# Patient Record
Sex: Female | Born: 1960
Health system: Southern US, Community
[De-identification: ages and names within clinical notes are randomized; demographics above are authoritative.]

## PROBLEM LIST (undated history)

## (undated) DIAGNOSIS — E118 Type 2 diabetes mellitus with unspecified complications: Secondary | ICD-10-CM

## (undated) DIAGNOSIS — I10 Essential (primary) hypertension: Secondary | ICD-10-CM

## (undated) DIAGNOSIS — I214 Non-ST elevation (NSTEMI) myocardial infarction: Secondary | ICD-10-CM

## (undated) DIAGNOSIS — E785 Hyperlipidemia, unspecified: Secondary | ICD-10-CM

## (undated) DIAGNOSIS — I25119 Atherosclerotic heart disease of native coronary artery with unspecified angina pectoris: Secondary | ICD-10-CM

## (undated) DIAGNOSIS — E1169 Type 2 diabetes mellitus with other specified complication: Secondary | ICD-10-CM

## (undated) HISTORY — DX: Atherosclerotic heart disease of native coronary artery with unspecified angina pectoris: I25.119

## (undated) HISTORY — DX: Non-ST elevation (NSTEMI) myocardial infarction: I21.4

## (undated) HISTORY — DX: Hyperlipidemia, unspecified: E78.5

## (undated) HISTORY — DX: Type 2 diabetes mellitus with unspecified complications: E11.8

## (undated) HISTORY — DX: Essential (primary) hypertension: I10

## (undated) HISTORY — DX: Type 2 diabetes mellitus with other specified complication: E11.69

---

## 1996-10-13 HISTORY — PX: LEFT OOPHORECTOMY: SHX1961

## 2011-10-14 DIAGNOSIS — C50919 Malignant neoplasm of unspecified site of unspecified female breast: Secondary | ICD-10-CM

## 2011-10-14 HISTORY — DX: Malignant neoplasm of unspecified site of unspecified female breast: C50.919

## 2012-10-13 DIAGNOSIS — I2510 Atherosclerotic heart disease of native coronary artery without angina pectoris: Secondary | ICD-10-CM | POA: Insufficient documentation

## 2012-10-13 DIAGNOSIS — I214 Non-ST elevation (NSTEMI) myocardial infarction: Secondary | ICD-10-CM

## 2012-10-13 DIAGNOSIS — I252 Old myocardial infarction: Secondary | ICD-10-CM | POA: Insufficient documentation

## 2012-10-13 DIAGNOSIS — I25119 Atherosclerotic heart disease of native coronary artery with unspecified angina pectoris: Secondary | ICD-10-CM

## 2012-10-13 HISTORY — PX: BREAST LUMPECTOMY: SHX2

## 2012-10-13 HISTORY — DX: Atherosclerotic heart disease of native coronary artery with unspecified angina pectoris: I25.119

## 2012-10-13 HISTORY — DX: Non-ST elevation (NSTEMI) myocardial infarction: I21.4

## 2013-02-15 HISTORY — PX: TRANSTHORACIC ECHOCARDIOGRAM: SHX275

## 2013-02-15 HISTORY — PX: CARDIAC CATHETERIZATION: SHX172

## 2013-02-16 HISTORY — PX: PERCUTANEOUS CORONARY STENT INTERVENTION (PCI-S): SHX6016

## 2017-02-16 MED FILL — BRILINTA 90 MG TABLET: 90 | 30 days supply | Qty: 60 | Fill #0

## 2017-02-16 MED FILL — ATORVASTATIN 80 MG TABLET: 80 | 90 days supply | Qty: 90 | Fill #0

## 2017-02-16 MED FILL — LETROZOLE 2.5 MG TABLET: 2.5 | 30 days supply | Qty: 30 | Fill #0

## 2017-02-16 MED FILL — CARVEDILOL 25 MG TABLET: 25 | 30 days supply | Qty: 60 | Fill #0

## 2017-02-16 MED FILL — LISINOPRIL 5 MG TABLET: 5 | 90 days supply | Qty: 90 | Fill #0

## 2017-02-16 MED FILL — metFORMIN HCL 500 MG TABS: 500 | 30 days supply | Qty: 60 | Fill #0

## 2017-03-15 MED FILL — metFORMIN HCL 500 MG TABS: 500 | 30 days supply | Qty: 60 | Fill #1

## 2017-03-15 MED FILL — CARVEDILOL 25 MG TABLET: 25 | 30 days supply | Qty: 60 | Fill #1

## 2017-03-15 MED FILL — BRILINTA 90 MG TABLET: 90 | 30 days supply | Qty: 60 | Fill #1

## 2017-04-20 MED FILL — BRILINTA 90 MG TABLET: 90 | 30 days supply | Qty: 60 | Fill #2

## 2017-04-20 MED FILL — LETROZOLE 2.5 MG TABLET: 2.5 | 30 days supply | Qty: 30 | Fill #0

## 2017-04-20 MED FILL — metFORMIN HCL 500 MG TABS: 500 | 30 days supply | Qty: 60 | Fill #2

## 2017-04-20 MED FILL — CARVEDILOL 25 MG TABLET: 25 | 90 days supply | Qty: 180 | Fill #0

## 2017-05-14 DIAGNOSIS — I10 Essential (primary) hypertension: Secondary | ICD-10-CM | POA: Diagnosis not present

## 2017-05-14 DIAGNOSIS — E6609 Other obesity due to excess calories: Secondary | ICD-10-CM | POA: Diagnosis not present

## 2017-05-14 DIAGNOSIS — E119 Type 2 diabetes mellitus without complications: Secondary | ICD-10-CM | POA: Diagnosis not present

## 2017-05-14 DIAGNOSIS — E7800 Pure hypercholesterolemia, unspecified: Secondary | ICD-10-CM | POA: Diagnosis not present

## 2017-05-14 DIAGNOSIS — Z720 Tobacco use: Secondary | ICD-10-CM | POA: Diagnosis not present

## 2017-05-14 DIAGNOSIS — K219 Gastro-esophageal reflux disease without esophagitis: Secondary | ICD-10-CM | POA: Diagnosis not present

## 2017-05-14 DIAGNOSIS — Z6832 Body mass index (BMI) 32.0-32.9, adult: Secondary | ICD-10-CM | POA: Diagnosis not present

## 2017-05-14 DIAGNOSIS — M25511 Pain in right shoulder: Secondary | ICD-10-CM | POA: Diagnosis not present

## 2017-05-14 DIAGNOSIS — I2510 Atherosclerotic heart disease of native coronary artery without angina pectoris: Secondary | ICD-10-CM | POA: Diagnosis not present

## 2017-05-14 MED FILL — metFORMIN HCL 500 MG TABS: 500 | 90 days supply | Qty: 180 | Fill #0

## 2017-05-14 MED FILL — ATORVASTATIN 80 MG TABLET: 80 | 90 days supply | Qty: 90 | Fill #0

## 2017-05-14 MED FILL — OMEPRAZOLE 20 MG CAP: 20 | 90 days supply | Qty: 90 | Fill #0

## 2017-05-14 MED FILL — ASPIRIN 81 MG CHEWABLE TAB: 81 | 36 days supply | Qty: 36 | Fill #0

## 2017-05-14 MED FILL — LISINOPRIL 5 MG TABLET: 5 | 90 days supply | Qty: 90 | Fill #0

## 2017-05-25 MED FILL — BRILINTA 90 MG TABLET: 90 | 30 days supply | Qty: 60 | Fill #3

## 2017-05-25 MED FILL — LETROZOLE 2.5 MG TABLET: 2.5 | 30 days supply | Qty: 30 | Fill #1

## 2017-06-03 MED FILL — ASPIRIN ADULT LOW STRENGTH: 81 | 90 days supply | Qty: 90 | Fill #0

## 2017-06-08 ENCOUNTER — Telehealth: Payer: Self-pay | Admitting: Cardiology

## 2017-06-08 NOTE — Telephone Encounter (Signed)
Received records from [place] for appointment on 07/02/17 with Dr Ellyn Hack.  Records put with Dr Allison Quarry schedule for 07/02/17. lp

## 2017-06-22 MED FILL — BRILINTA 90 MG TABLET: 90 | 30 days supply | Qty: 60 | Fill #4

## 2017-06-22 MED FILL — LETROZOLE 2.5 MG TABLET: 2.5 | 30 days supply | Qty: 30 | Fill #2

## 2017-07-02 ENCOUNTER — Ambulatory Visit: Payer: Self-pay | Admitting: Cardiology

## 2017-07-02 ENCOUNTER — Encounter: Payer: Self-pay | Admitting: Cardiology

## 2017-07-02 ENCOUNTER — Ambulatory Visit (INDEPENDENT_AMBULATORY_CARE_PROVIDER_SITE_OTHER): Payer: 59 | Admitting: Cardiology

## 2017-07-02 VITALS — BP 128/68 | HR 80 | Ht 65.0 in | Wt 197.8 lb

## 2017-07-02 DIAGNOSIS — E785 Hyperlipidemia, unspecified: Secondary | ICD-10-CM | POA: Diagnosis not present

## 2017-07-02 DIAGNOSIS — I252 Old myocardial infarction: Secondary | ICD-10-CM | POA: Diagnosis not present

## 2017-07-02 DIAGNOSIS — I25119 Atherosclerotic heart disease of native coronary artery with unspecified angina pectoris: Secondary | ICD-10-CM | POA: Diagnosis not present

## 2017-07-02 DIAGNOSIS — E1169 Type 2 diabetes mellitus with other specified complication: Secondary | ICD-10-CM

## 2017-07-02 DIAGNOSIS — I10 Essential (primary) hypertension: Secondary | ICD-10-CM | POA: Diagnosis not present

## 2017-07-02 DIAGNOSIS — E118 Type 2 diabetes mellitus with unspecified complications: Secondary | ICD-10-CM | POA: Diagnosis not present

## 2017-07-02 MED ORDER — CARVEDILOL 25 MG PO TABS
25.0000 mg | ORAL_TABLET | Freq: Two times a day (BID) | ORAL | 3 refills | Status: DC
Start: 2017-07-02 — End: 2021-02-13

## 2017-07-02 MED ORDER — BRILINTA 90 MG PO TABS: 90.0000 mg | ORAL_TABLET | Freq: Two times a day (BID) | ORAL | 3 refills | Status: DC

## 2017-07-02 MED ORDER — LISINOPRIL 5 MG PO TABS: 5.0000 mg | ORAL_TABLET | Freq: Every day | ORAL | 3 refills | Status: DC

## 2017-07-02 NOTE — Progress Notes (Signed)
PCP: Lois Huxley, PA  Clinic Note: Chief Complaint  Patient presents with  . New Patient (Initial Visit)    Establish cardiology care  . Coronary Artery Disease    History of non-STEMI with PCI circumflex     HPI: Cindy Weber is a 56 y.o. female who is being seen today for establishing a new cardiologist in this area. She has a history of MI and cardiovascular disease. Referral is at the request of Lois Huxley, [place]. She has moved from [facility] where she is being cared for by Dr. Redmond Pulling of [facility] in [place]. Per report, she had a history of MI with 2 stents back in 2014. She states aspirin and Brilinta along with carvedilol. She takes 80 mg atorvastatin for lipids.  Cindy Weber was last seen in Jan-Feb by Dr. Redmond Pulling - she was doing well at that time with no active anginal symptoms. She noted some right shoulder pain that responded to muscle relaxant.  Recent Hospitalizations: None  Studies Personally Reviewed - (if available, images/films reviewed: From Epic Chart or Care Everywhere) -- [facility]  02/15/2013: CTA chest -([facility]): No PE. No cardiomyopathy. Mild-moderate centrilobular and paraseptal emphysema. Few tiny groundglass nodules.  02/15/2013 ECHO:  Normal LV Size & function. EF ~55-60%.  GR1DD. Mild MR & LAE.   02/16/2013 - cath-pci, 100%CTOof RCA (small co-dom);  PCI dCX 95%,- Xience expedition DES 2.25 mm x 15 mm overlap approximately with a Xience exhibition DES 2.5 mm x 23 mm -postdilated to 2.5 mm throughout.  Interval History: Cindy Weber presents today for establishing cardiology care here in [facility] really without any major cardiac complaints. She really hasn't had any anginal symptoms with rest or exertion since her MI in 2014. She describes her MI discomfort as being intermittent episodes of chest pain that usually occurred at the end of the day when she would lie down at night. She will feel tired during  the day and fatigued and maybe a little bit short of breath, but didn't notice the chest discomfort until she would lay down. Finally after couple days she decided to go to the emergency room in [place] where she was working. There they were concerned and wondering rule out for MI. Her second set of troponins were highly positive and she therefore was transferred to [facility] with a diagnosis of non-STEMI presenting as angina decubitus.  She has not had any similar symptoms of this. She has had recurrent right shoulder pain for the last couple years and is now finding on seeing orthopedic surgeon for this. She has been very well-controlled from a diabetes standpoint. Last documented A1c was in the 6 range. Her blood pressures at home well-controlled. She is cutting back smoking, but is still not yet ready to give up the last few cigarettes.  No chest pain or shortness of breath with rest or exertion. No PND, orthopnea or edema. No palpitations, lightheadedness, dizziness, weakness or syncope/near syncope. No TIA/amaurosis fugax symptoms. No melena, hematochezia, hematuria, or epstaxis. No claudication.  ROS: A comprehensive was performed. Pertinent symptoms noted above Review of Systems  Constitutional: Negative for malaise/fatigue.  HENT: Negative for congestion.   Respiratory: Positive for cough (Sometimes in the morning. Nonproductive). Negative for shortness of breath and wheezing.   Gastrointestinal: Positive for heartburn. Negative for abdominal pain, constipation and diarrhea.  Musculoskeletal: Negative for myalgias.       Right shoulder pain  Skin: Negative.  Neurological: Negative for dizziness.  Psychiatric/Behavioral: Negative for memory loss. The patient is not nervous/anxious and does not have insomnia.   All other systems reviewed and are negative.   I have reviewed and (if needed) personally updated the patient's problem list, medications, allergies, past medical and  surgical history, social and family history.   Past Medical History:  Diagnosis Date  . Coronary artery disease involving native heart with angina pectoris ([place]) 02/2013   Non-STEMI - 100% CTO RCA (small codominant posterior and.; Normal LAD. The distal circumflex 95% stenosis - PCI with DES 2 (Xience expedition stents overlapping -2.25 mm x 15 mm and 2.5 mm x 23 mm both postdilated 2.5 mm balloon)  . DM (diabetes mellitus), type 2 with complications (HCC)    CAD - MI.   . Essential hypertension   . Hyperlipidemia associated with type 2 diabetes mellitus ([place])   . Non-STEMI (non-ST elevated myocardial infarction) ([place]) 2014   PCI - (2 stents) -formerly cared for by Dr. Redmond Pulling [facility].    Past Surgical History:  Procedure Laterality Date  . BREAST LUMPECTOMY Left 2014  . CARDIAC CATHETERIZATION  02/15/2013   [facility] (Dr. Daiva Huge): Codominant system.  100% CTO of small RCA.  d Cx 95%, normal LAD. -> PCI of dCx  . LEFT OOPHORECTOMY  1998  . PERCUTANEOUS CORONARY STENT INTERVENTION (PCI-S)  02/16/2013   [facility] -(Dr. Daiva Huge): PCI [place] 95%,- Xience expedition DES 2.25 mm x 15 mm overlap approximately with a Xience exhibition DES 2.5 mm x 23 mm -postdilated to 2.5 mm throughout  . TRANSTHORACIC ECHOCARDIOGRAM  02/15/2013   [place]: Normal LV Size & function. EF ~55-60%.  GR1DD. Mild MR & LAE.      Current Meds  Medication Sig  . atorvastatin (LIPITOR) 80 MG tablet Take 1 tablet by mouth daily.  Marland Kitchen BRILINTA 90 MG TABS tablet Take 1 tablet (90 mg total) by mouth 2 (two) times daily.  . carvedilol (COREG) 25 MG tablet Take 1 tablet (25 mg total) by mouth 2 (two) times daily.  . cyanocobalamin (V-R VITAMIN B-12) 500 MCG tablet Take 1 tablet by mouth daily.  Marland Kitchen letrozole (FEMARA) 2.5 MG tablet Take 1 tablet by mouth daily.  Marland Kitchen lisinopril (PRINIVIL,ZESTRIL) 5 MG tablet Take 1 tablet (5 mg total) by mouth daily.  . metFORMIN (GLUCOPHAGE) 500 MG tablet Take 1 tablet by mouth 2 (two) times daily.    Marland Kitchen omeprazole (PRILOSEC) 20 MG capsule Take 1 capsule by mouth daily.  . [DISCONTINUED] aspirin 81 MG EC tablet Take 1 tablet by mouth daily.  . [DISCONTINUED] BRILINTA 90 MG TABS tablet Take 1 tablet by mouth 2 (two) times daily.  . [DISCONTINUED] carvedilol (COREG) 25 MG tablet Take 25 mg by mouth 2 (two) times daily.  . [DISCONTINUED] lisinopril (PRINIVIL,ZESTRIL) 5 MG tablet Take 1 tablet by mouth daily.    Not on File  Social History   Social History  . Marital status: Married    Spouse name: N/A  . Number of children: N/A  . Years of education: N/A   Social History Main Topics  . Smoking status: Current Every Day Smoker    Types: Cigarettes  . Smokeless tobacco: Never Used  . Alcohol use None  . Drug use: Unknown  . Sexual activity: Not Asked   Other Topics Concern  . None   Social History Narrative   She is married to [place] who is a retired Network engineer. She smokes about half pack a day and has  done so for many years. No recreational drug use. Rare alcohol use.   I have 3 children aged 65, 83 and 25. She currently works as a Chartered certified accountant for [employer]  She works as a Chartered certified accountant for [employer].  family history includes Diabetes in her brother, maternal grandfather, mother, sister, and son; Heart attack in her brother; Heart disease in her brother and mother; Hypertension in her brother, brother, daughter, sister, and son; Liver disease in her father.  Wt Readings from Last 3 Encounters:  07/02/17 197 lb 12.8 oz (89.7 kg)    PHYSICAL EXAM BP 128/68   Pulse 80   Ht 5\' 5"  (1.651 m)   Wt 197 lb 12.8 oz (89.7 kg)   BMI 32.92 kg/m  Physical Exam  Constitutional: She is oriented to person, place, and time. She appears well-developed and well-nourished. No distress.  Mildly obese. Well groomed. She does smell of cigarette smoke.  HENT:  Head: Normocephalic.  Eyes: Pupils are equal, round, and reactive to light. Conjunctivae and EOM are normal. No scleral  icterus.  Neck: Normal range of motion. Neck supple. No hepatojugular reflux and no JVD present. Carotid bruit is not present.  Cardiovascular: Normal rate, regular rhythm, normal heart sounds and normal pulses.   No extrasystoles are present. PMI is not displaced.  Exam reveals no gallop, no distant heart sounds and no midsystolic click.   No murmur heard. Pulmonary/Chest: Effort normal and breath sounds normal. No respiratory distress. She has no wheezes. She has no rales.  Mild diffuse interstitial sounds  Abdominal: Soft. Bowel sounds are normal. She exhibits no distension. There is no tenderness. There is no rebound.  Mild truncal obesity  Musculoskeletal: Normal range of motion. She exhibits no edema.  Lymphadenopathy:    She has no cervical adenopathy.  Neurological: She is alert and oriented to person, place, and time. No cranial nerve deficit.  Skin: Skin is warm and dry. No rash noted. No erythema.  Psychiatric: She has a normal mood and affect. Her behavior is normal. Judgment normal.  Nursing note and vitals reviewed.    Adult ECG Report  Rate: 80 ;  Rhythm: normal sinus rhythm and 1 AVB (PR interval 208). Otherwise normal axis, intervals and durations.;   Narrative Interpretation: Essentially Normal EKG  Other studies Reviewed: Additional studies/ records that were reviewed today include:  Recent Labs: 05/14/17 (PCP)   Na+ 140, K+ 4.4, Cl- 106, HCO3- 28 , BUN 13, Cr 0.68, Glu 114, Ca2+ 10.1; AST 12, ALT, 18, AlkP 50  CBC: W 8.7, H/H 13.5/40, Plt 229  TC 65, TG 71, HDL 21, LDL 30; A1c 6.5    ASSESSMENT / PLAN: Problem List Items Addressed This Visit    Coronary artery disease involving native heart with angina pectoris ([place]) - Primary (Chronic)    2 vessel disease with PCI to the circumflex and known occlusion of the non-or codominant RCA that seemed to be small and not significant. Preserved ejection fraction on LV gram and echo. No active anginal symptoms. She has  been maintained on a good solid regimen.   She is on aspirin plus Brilinta and I think at this stage we can stop the aspirin since she is having some upset stomach. Would also potentially reduce Brilinta suggested to 60 mg tablets. She is on high-dose atorvastatin with excellent lipids. Maybe about a back off on that dose to 40 mg. She is on high-dose carvedilol and low-dose lisinopril with well-controlled blood  pressure. She is doing very well and does not require any medication adjustments. We will to simply continue her regular regimen.      Relevant Medications   atorvastatin (LIPITOR) 80 MG tablet   carvedilol (COREG) 25 MG tablet   lisinopril (PRINIVIL,ZESTRIL) 5 MG tablet   Other Relevant Orders   EKG 12-Lead   DM (diabetes mellitus), type 2 with complications (HCC) (Chronic)    Fairly well controlled with hemoglobin A1c of 6.5 on metformin. Defer management to PCP.      Relevant Medications   metFORMIN (GLUCOPHAGE) 500 MG tablet   atorvastatin (LIPITOR) 80 MG tablet   lisinopril (PRINIVIL,ZESTRIL) 5 MG tablet   Other Relevant Orders   EKG 12-Lead   Essential hypertension (Chronic)    Blood pressure well controlled on current regimen.      Relevant Medications   atorvastatin (LIPITOR) 80 MG tablet   carvedilol (COREG) 25 MG tablet   lisinopril (PRINIVIL,ZESTRIL) 5 MG tablet   Other Relevant Orders   EKG 12-Lead   History of MI (myocardial infarction) (Chronic)    Lumbar with expected some inferior wall motion changes with an occluded RCA and severe circumflex which disease. EF however is preserved on echocardiogram and her circumflex was revascularized with 2 overlapping stents. In the absence of symptoms, I don't think we need to do a additional testing at this time. Simply continue home medications.      Relevant Orders   EKG 12-Lead   Hyperlipidemia associated with type 2 diabetes mellitus ([place]) (Chronic)    Outstanding lipids on last check. I think we can probably  reduce atorvastatin to 40 mg if not 20. Will defer to PCP  In the absence of any arthralgias or myalgias.      Relevant Medications   metFORMIN (GLUCOPHAGE) 500 MG tablet   atorvastatin (LIPITOR) 80 MG tablet   carvedilol (COREG) 25 MG tablet   lisinopril (PRINIVIL,ZESTRIL) 5 MG tablet   Other Relevant Orders   EKG 12-Lead      Current medicines are reviewed at length with the patient today. (+/- concerns) n/a The following changes have been made: n/a  Patient Instructions   CHANGES WITH CURRENT MEDICATION   STOP ASPIRIN   NO OTHER CHANGES WITH TREATMENT   Your physician wants you to follow-up in [place] DR HARDING.You will receive a reminder letter in the mail two months in advance. If you don't receive a letter, please call our office to schedule the follow-up appointment.   If you need a refill on your cardiac medications before your next appointment, please call your pharmacy.     Studies Ordered:   Orders Placed This Encounter  Procedures  . EKG 12-Lead      Glenetta Hew, M.D., M.S. Interventional Cardiologist   Pager # [phone] Phone # [phone] [address] [place], [place] [address]

## 2017-07-02 NOTE — Patient Instructions (Addendum)
CHANGES WITH CURRENT MEDICATION   STOP ASPIRIN   NO OTHER CHANGES WITH TREATMENT   Your physician wants you to follow-up in 12 MONTHS WITH DR HARDING.You will receive a reminder letter in the mail two months in advance. If you don't receive a letter, please call our office to schedule the follow-up appointment.   If you need a refill on your cardiac medications before your next appointment, please call your pharmacy.

## 2017-07-04 ENCOUNTER — Encounter: Payer: Self-pay | Admitting: Cardiology

## 2017-07-04 NOTE — Assessment & Plan Note (Signed)
Fairly well controlled with hemoglobin A1c of 6.5 on metformin. Defer management to PCP.

## 2017-07-04 NOTE — Assessment & Plan Note (Signed)
Blood pressure well controlled on current regimen

## 2017-07-04 NOTE — Assessment & Plan Note (Signed)
Lumbar with expected some inferior wall motion changes with an occluded RCA and severe circumflex which disease. EF however is preserved on echocardiogram and her circumflex was revascularized with 2 overlapping stents. In the absence of symptoms, I don't think we need to do a additional testing at this time. Simply continue home medications.

## 2017-07-04 NOTE — Assessment & Plan Note (Signed)
Outstanding lipids on last check. I think we can probably reduce atorvastatin to 40 mg if not 20. Will defer to PCP  In the absence of any arthralgias or myalgias.

## 2017-07-04 NOTE — Assessment & Plan Note (Signed)
2 vessel disease with PCI to the circumflex and known occlusion of the non-or codominant RCA that seemed to be small and not significant. Preserved ejection fraction on LV gram and echo. No active anginal symptoms. She has been maintained on a good solid regimen.   She is on aspirin plus Brilinta and I think at this stage we can stop the aspirin since she is having some upset stomach. Would also potentially reduce Brilinta suggested to 60 mg tablets. She is on high-dose atorvastatin with excellent lipids. Maybe about a back off on that dose to 40 mg. She is on high-dose carvedilol and low-dose lisinopril with well-controlled blood pressure. She is doing very well and does not require any medication adjustments. We will to simply continue her regular regimen.

## 2017-07-27 MED FILL — BRILINTA 90 MG TABLET: 90 | 90 days supply | Qty: 180 | Fill #0

## 2017-07-27 MED FILL — LETROZOLE 2.5 MG TABLET: 2.5 | 90 days supply | Qty: 90 | Fill #3

## 2017-07-27 MED FILL — CARVEDILOL 25 MG TABLET: 25 | 90 days supply | Qty: 180 | Fill #1

## 2017-08-05 ENCOUNTER — Encounter (INDEPENDENT_AMBULATORY_CARE_PROVIDER_SITE_OTHER): Payer: Self-pay | Admitting: Orthopedic Surgery

## 2017-08-05 ENCOUNTER — Ambulatory Visit (INDEPENDENT_AMBULATORY_CARE_PROVIDER_SITE_OTHER): Payer: 59

## 2017-08-05 ENCOUNTER — Ambulatory Visit (INDEPENDENT_AMBULATORY_CARE_PROVIDER_SITE_OTHER): Payer: 59 | Admitting: Orthopedic Surgery

## 2017-08-05 DIAGNOSIS — G8929 Other chronic pain: Secondary | ICD-10-CM

## 2017-08-05 DIAGNOSIS — M25511 Pain in right shoulder: Secondary | ICD-10-CM

## 2017-08-05 DIAGNOSIS — M7501 Adhesive capsulitis of right shoulder: Secondary | ICD-10-CM | POA: Diagnosis not present

## 2017-08-05 MED ORDER — IBUPROFEN-FAMOTIDINE 800-26.6 MG PO TABS: 1.0000 | ORAL_TABLET | Freq: Two times a day (BID) | ORAL | 0 refills | Status: AC | PRN

## 2017-08-05 NOTE — Progress Notes (Signed)
Office Visit Note   Patient: Cindy Weber           Date of Birth: 12/01/1960           MRN: [id number] Visit Date: 08/05/2017 Requested by: Lois Huxley, [place], [place] [address] PCP: Lois Huxley, PA  Subjective: Chief Complaint  Patient presents with  . Right Shoulder - Pain    HPI: Cindy Weber is a 56 year old patient with right shoulder pain.  Denies any history of injury.  Describes pain for 2 years.  She states the pain is getting worse.  She is right-hand dominant.  Denies any neck pain or radicular pain but does report occasional numbness and tingling in the hand dorsal aspect.  States the pain localizes to the shoulder region.  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  She states that abduction hurts as well as crossed arm adduction.  Takes occasional ibuprofen which doesn't help.  Overhead difficulty is encountered with any overhead motion.              ROS: All systems reviewed are negative as they relate to the chief complaint within the history of present illness.  Patient denies  fevers or chills.   Assessment & Plan: Visit Diagnoses:  1. Chronic right shoulder pain   2. Adhesive capsulitis of right shoulder     Plan: Impression is right shoulder adhesive capsulitis with good rotator cuff strength and limited passive range of motion.  Discussed this with Cindy Weber in terms of incremental intervention.  She wants to start with Duexis which is prescribed.  We will also try physical therapy for passive range of motion and manual PT for stretching.  I'll see her back in 8 weeks and we could consider intra-articular cortisone injection at that time  Follow-Up Instructions: No Follow-up on file.   Orders:  Orders Placed This Encounter  Procedures  . XR Shoulder Right  . Ambulatory referral to Physical Therapy   Meds ordered this encounter  Medications  . Ibuprofen-Famotidine (DUEXIS) 800-26.6 MG TABS    Sig: Take 1 tablet by  mouth 2 (two) times daily as needed.    Dispense:  90 tablet    Refill:  0      Procedures: No procedures performed   Clinical Data: No additional findings.  Objective: Vital Signs: There were no vitals taken for this visit.  Physical Exam:   Constitutional: Patient appears well-developed HEENT:  Head: Normocephalic Eyes:EOM are normal Neck: Normal range of motion Cardiovascular: Normal rate Pulmonary/chest: Effort normal Neurologic: Patient is alert Skin: Skin is warm Psychiatric: Patient has normal mood and affect    Ortho Exam: Orthopedic exam demonstrates good cervical spine range of motion with no paresthesias C5-T1.  Radial pulses intact.  No masses lymph adenopathy or skin changes noted in the neck or shoulder girdle region.  She does have limited external rotation at 15 of abduction on the right to 45 on the left 75.  Isolated glenohumeral abduction 80 on the right 110 on the left.  Forward flexion is 120 on the right compared to 180 on the left.  Rotator cuff strength on the right is excellent to infraspinatus supraspinatus and subscap muscle testing.  No coarseness or grinding with internal/external rotation of that right arm localizing to the joint.  She does have a little bit of acromioclavicular joint crepitus with range of motion  Specialty Comments:  No specialty comments available.  Imaging: Xr  Shoulder Right  Result Date: 08/05/2017 AP axillary lateral outlet view right shoulder reviewed.  Moderate acromioclavicular joint arthritis is noted with type I acromion.  No fracture or dislocation present.  No glenohumeral arthritis present.  Visualized lung fields clear    PMFS History: Patient Active Problem List   Diagnosis Date Noted  . DM (diabetes mellitus), type 2 with complications ([place])   . Essential hypertension   . Hyperlipidemia associated with type 2 diabetes mellitus ([place])   . Coronary artery disease involving native heart with angina  pectoris ([place]) 10/13/2012  . History of MI (myocardial infarction) 10/13/2012   Past Medical History:  Diagnosis Date  . Coronary artery disease involving native heart with angina pectoris ([place]) 02/2013   Non-STEMI - 100% CTO RCA (small codominant posterior and.; Normal LAD. The distal circumflex 95% stenosis - PCI with DES 2 (Xience expedition stents overlapping -2.25 mm x 15 mm and 2.5 mm x 23 mm both postdilated 2.5 mm balloon)  . DM (diabetes mellitus), type 2 with complications (HCC)    CAD - MI.   . Essential hypertension   . Hyperlipidemia associated with type 2 diabetes mellitus ([place])   . Non-STEMI (non-ST elevated myocardial infarction) ([place]) 2014   PCI - (2 stents) -formerly cared for by Dr. Redmond Pulling [facility].    Family History  Problem Relation Age of Onset  . Diabetes Mother   . Heart disease Mother   . Liver disease Father   . Healthy Sister   . Hypertension Brother   . Diabetes Maternal Grandfather   . Diabetes Sister   . Hypertension Sister   . Heart disease Brother   . Heart attack Brother   . Diabetes Brother   . Hypertension Brother   . Hypertension Daughter   . Hypertension Son   . Diabetes Son   . Diabetes Son     Past Surgical History:  Procedure Laterality Date  . BREAST LUMPECTOMY Left 2014  . CARDIAC CATHETERIZATION  02/15/2013   [facility] (Dr. Daiva Huge): Codominant system.  100% CTO of small RCA.  d Cx 95%, normal LAD. -> PCI of dCx  . LEFT OOPHORECTOMY  1998  . PERCUTANEOUS CORONARY STENT INTERVENTION (PCI-S)  02/16/2013   [facility] -(Dr. Daiva Huge): PCI [place] 95%,- Xience expedition DES 2.25 mm x 15 mm overlap approximately with a Xience exhibition DES 2.5 mm x 23 mm -postdilated to 2.5 mm throughout  . TRANSTHORACIC ECHOCARDIOGRAM  02/15/2013   [place]: Normal LV Size & function. EF ~55-60%.  GR1DD. Mild MR & LAE.    Social History   Occupational History  . Not on file.   Social History Main Topics  . Smoking status: Current Every Day Smoker     Types: Cigarettes  . Smokeless tobacco: Never Used  . Alcohol use Not on file  . Drug use: Unknown  . Sexual activity: Not on file

## 2017-08-31 MED FILL — ASPIRIN ADULT LOW STRENGTH: 81 | 90 days supply | Qty: 90 | Fill #1

## 2017-08-31 MED FILL — metFORMIN HCL 500 MG TABS: 500 | 90 days supply | Qty: 180 | Fill #1

## 2017-08-31 MED FILL — OMEPRAZOLE 20 MG CAP: 20 | 90 days supply | Qty: 90 | Fill #1

## 2017-08-31 MED FILL — LISINOPRIL 5 MG TAB: 5 | 90 days supply | Qty: 90 | Fill #1

## 2017-09-09 MED FILL — ATORVASTATIN 80 MG TABLET: 80 | 90 days supply | Qty: 90 | Fill #1

## 2017-09-15 DIAGNOSIS — I10 Essential (primary) hypertension: Secondary | ICD-10-CM | POA: Diagnosis not present

## 2017-09-15 DIAGNOSIS — Z7984 Long term (current) use of oral hypoglycemic drugs: Secondary | ICD-10-CM | POA: Diagnosis not present

## 2017-09-15 DIAGNOSIS — E6609 Other obesity due to excess calories: Secondary | ICD-10-CM | POA: Diagnosis not present

## 2017-09-15 DIAGNOSIS — Z853 Personal history of malignant neoplasm of breast: Secondary | ICD-10-CM | POA: Diagnosis not present

## 2017-09-15 DIAGNOSIS — I2510 Atherosclerotic heart disease of native coronary artery without angina pectoris: Secondary | ICD-10-CM | POA: Diagnosis not present

## 2017-09-15 DIAGNOSIS — E7800 Pure hypercholesterolemia, unspecified: Secondary | ICD-10-CM | POA: Diagnosis not present

## 2017-09-15 DIAGNOSIS — K219 Gastro-esophageal reflux disease without esophagitis: Secondary | ICD-10-CM | POA: Diagnosis not present

## 2017-09-15 DIAGNOSIS — Z720 Tobacco use: Secondary | ICD-10-CM | POA: Diagnosis not present

## 2017-09-15 DIAGNOSIS — Z6832 Body mass index (BMI) 32.0-32.9, adult: Secondary | ICD-10-CM | POA: Diagnosis not present

## 2017-09-15 DIAGNOSIS — E1165 Type 2 diabetes mellitus with hyperglycemia: Secondary | ICD-10-CM | POA: Diagnosis not present

## 2017-09-30 ENCOUNTER — Ambulatory Visit (INDEPENDENT_AMBULATORY_CARE_PROVIDER_SITE_OTHER): Payer: 59 | Admitting: Orthopedic Surgery

## 2017-09-30 ENCOUNTER — Encounter (INDEPENDENT_AMBULATORY_CARE_PROVIDER_SITE_OTHER): Payer: Self-pay | Admitting: Orthopedic Surgery

## 2017-09-30 DIAGNOSIS — M25511 Pain in right shoulder: Principal | ICD-10-CM

## 2017-09-30 DIAGNOSIS — G8929 Other chronic pain: Secondary | ICD-10-CM | POA: Diagnosis not present

## 2017-10-03 ENCOUNTER — Encounter (INDEPENDENT_AMBULATORY_CARE_PROVIDER_SITE_OTHER): Payer: Self-pay | Admitting: Orthopedic Surgery

## 2017-10-03 DIAGNOSIS — M25511 Pain in right shoulder: Secondary | ICD-10-CM

## 2017-10-03 DIAGNOSIS — G8929 Other chronic pain: Secondary | ICD-10-CM

## 2017-10-03 MED ORDER — LIDOCAINE HCL 1 % IJ SOLN
5.0000 mL | INTRAMUSCULAR | Status: AC | PRN
  Administered 2017-10-03: 5 mL

## 2017-10-03 MED ORDER — BUPIVACAINE HCL 0.5 % IJ SOLN
9.0000 mL | INTRAMUSCULAR | Status: AC | PRN
  Administered 2017-10-03: 9 mL via INTRA_ARTICULAR

## 2017-10-03 MED ORDER — METHYLPREDNISOLONE ACETATE 40 MG/ML IJ SUSP
40.0000 mg | INTRAMUSCULAR | Status: AC | PRN
  Administered 2017-10-03: 40 mg via INTRA_ARTICULAR

## 2017-10-03 NOTE — Progress Notes (Signed)
Office Visit Note   Patient: Cindy Weber           Date of Birth: 01-18-1961           MRN: [id number] Visit Date: 09/30/2017 Requested by: Lois Huxley, [address] [place], [place] [address] PCP: Lois Huxley, PA  Subjective: Chief Complaint  Patient presents with  . Right Shoulder - Follow-up    HPI: [name] presents for follow-up of right shoulder pain.  Duexis helped her some.  Hurt her stomach a little bit.  She did not do any physical therapy.  She reports pain in the right shoulder.  Currently is working.  Would like a cortisone injection if possible.  She is okay sleeping on the right-hand side.  Occasionally the pain will radiate down to the hand.  Her husband is disabled from strokes.  She cannot really do anything in terms of surgery right now..              ROS:All systems reviewed are negative as they relate to the chief complaint within the history of present illness.  Patient denies  fevers or chills.  Assessment & Plan: Visit Diagnoses:  1. Chronic right shoulder pain     Plan: Impression is right shoulder pain.  Injection into the right shoulder glenohumeral joint.  If this is helped her before.  We will try again for that injection.  I will see her back as needed.  Follow-Up Instructions: Return if symptoms worsen or fail to improve.   Orders:  No orders of the defined types were placed in this encounter.  No orders of the defined types were placed in this encounter.     Procedures: Large Joint Inj: R glenohumeral on 10/03/2017 9:26 AM Indications: diagnostic evaluation and pain Details: 18 G 1.5 in needle, posterior approach  Arthrogram: No  Medications: 9 mL bupivacaine 0.5 %; 40 mg methylPREDNISolone acetate 40 MG/ML; 5 mL lidocaine 1 % Outcome: tolerated well, no immediate complications Procedure, treatment alternatives, risks and benefits explained, specific risks discussed. Consent was given by the patient. Immediately prior to  procedure a time out was called to verify the correct patient, procedure, equipment, support staff and site/side marked as required. Patient was prepped and draped in the usual sterile fashion.       Clinical Data: No additional findings.  Objective: Vital Signs: There were no vitals taken for this visit.  Physical Exam:   Constitutional: Patient appears well-developed HEENT:  Head: Normocephalic Eyes:EOM are normal Neck: Normal range of motion Cardiovascular: Normal rate Pulmonary/chest: Effort normal Neurologic: Patient is alert Skin: Skin is warm Psychiatric: Patient has normal mood and affect    Ortho Exam: Orthopedic exam demonstrates reasonable cervical spine range of motion.  Patient has right shoulder pain with motion.  Rotator cuff strength is slightly weak on the right compared to the left.  No masses lymphadenopathy or skin changes noted in the right shoulder girdle Specialty Comments:  No specialty comments available.  Imaging: No results found.   PMFS History: Patient Active Problem List   Diagnosis Date Noted  . DM (diabetes mellitus), type 2 with complications ([place])   . Essential hypertension   . Hyperlipidemia associated with type 2 diabetes mellitus ([place])   . Coronary artery disease involving native heart with angina pectoris ([place]) 10/13/2012  . History of MI (myocardial infarction) 10/13/2012   Past Medical History:  Diagnosis Date  . Coronary artery disease involving native heart with  angina pectoris ([place]) 02/2013   Non-STEMI - 100% CTO RCA (small codominant posterior and.; Normal LAD. The distal circumflex 95% stenosis - PCI with DES 2 (Xience expedition stents overlapping -2.25 mm x 15 mm and 2.5 mm x 23 mm both postdilated 2.5 mm balloon)  . DM (diabetes mellitus), type 2 with complications (HCC)    CAD - MI.   . Essential hypertension   . Hyperlipidemia associated with type 2 diabetes mellitus ([place])   . Non-STEMI (non-ST elevated myocardial  infarction) ([place]) 2014   PCI - (2 stents) -formerly cared for by Dr. Redmond Pulling [facility].    Family History  Problem Relation Age of Onset  . Diabetes Mother   . Heart disease Mother   . Liver disease Father   . Healthy Sister   . Hypertension Brother   . Diabetes Maternal Grandfather   . Diabetes Sister   . Hypertension Sister   . Heart disease Brother   . Heart attack Brother   . Diabetes Brother   . Hypertension Brother   . Hypertension Daughter   . Hypertension Son   . Diabetes Son   . Diabetes Son     Past Surgical History:  Procedure Laterality Date  . BREAST LUMPECTOMY Left 2014  . CARDIAC CATHETERIZATION  02/15/2013   [facility] (Dr. Daiva Huge): Codominant system.  100% CTO of small RCA.  d Cx 95%, normal LAD. -> PCI of dCx  . LEFT OOPHORECTOMY  1998  . PERCUTANEOUS CORONARY STENT INTERVENTION (PCI-S)  02/16/2013   [facility] -(Dr. Daiva Huge): PCI [place] 95%,- Xience expedition DES 2.25 mm x 15 mm overlap approximately with a Xience exhibition DES 2.5 mm x 23 mm -postdilated to 2.5 mm throughout  . TRANSTHORACIC ECHOCARDIOGRAM  02/15/2013   [place]: Normal LV Size & function. EF ~55-60%.  GR1DD. Mild MR & LAE.    Social History   Occupational History  . Not on file  Tobacco Use  . Smoking status: Current Every Day Smoker    Types: Cigarettes  . Smokeless tobacco: Never Used  Substance and Sexual Activity  . Alcohol use: Not on file  . Drug use: Not on file  . Sexual activity: Not on file

## 2017-10-30 MED FILL — CARVEDILOL 25 MG TABS: 25 | 90 days supply | Qty: 180 | Fill #0

## 2017-10-30 MED FILL — BRILINTA 90 MG TABLET: 90 | 90 days supply | Qty: 180 | Fill #1

## 2017-10-30 MED FILL — LETROZOLE 2.5 MG TABLET: 2.5 | 90 days supply | Qty: 90 | Fill #4

## 2017-10-30 MED FILL — metFORMIN HCL 500 MG TABS: 500 | 90 days supply | Qty: 360 | Fill #0

## 2017-12-09 MED FILL — LISINOPRIL 5 MG TAB: 5 | 90 days supply | Qty: 90 | Fill #0

## 2017-12-09 MED FILL — OMEPRAZOLE 20 MG CAP: 20 | 90 days supply | Qty: 90 | Fill #0

## 2018-01-08 MED FILL — ATORVASTATIN 80 MG TABLET: 80 | 90 days supply | Qty: 90 | Fill #0

## 2018-01-08 MED FILL — ASPIRIN ADULT LOW STRENGTH: 81 | 90 days supply | Qty: 90 | Fill #0

## 2018-01-29 MED FILL — CARVEDILOL 25 MG TABS: 25 | 90 days supply | Qty: 180 | Fill #1

## 2018-01-29 MED FILL — BRILINTA 90 MG TABLET: 90 | 90 days supply | Qty: 180 | Fill #2

## 2018-02-01 MED FILL — metFORMIN HCL 500 MG TABS: 500 | 90 days supply | Qty: 360 | Fill #1

## 2018-02-01 MED FILL — LETROZOLE 2.5 MG TABLET: 2.5 | 90 days supply | Qty: 90 | Fill #5

## 2018-03-18 MED FILL — LISINOPRIL 5 MG TABLET: 5 | 90 days supply | Qty: 90 | Fill #1

## 2018-03-19 DIAGNOSIS — E7800 Pure hypercholesterolemia, unspecified: Secondary | ICD-10-CM | POA: Diagnosis not present

## 2018-03-19 DIAGNOSIS — Z720 Tobacco use: Secondary | ICD-10-CM | POA: Diagnosis not present

## 2018-03-19 DIAGNOSIS — K219 Gastro-esophageal reflux disease without esophagitis: Secondary | ICD-10-CM | POA: Diagnosis not present

## 2018-03-19 DIAGNOSIS — Z0000 Encounter for general adult medical examination without abnormal findings: Secondary | ICD-10-CM | POA: Diagnosis not present

## 2018-03-19 DIAGNOSIS — E119 Type 2 diabetes mellitus without complications: Secondary | ICD-10-CM | POA: Diagnosis not present

## 2018-03-19 DIAGNOSIS — E669 Obesity, unspecified: Secondary | ICD-10-CM | POA: Diagnosis not present

## 2018-03-19 DIAGNOSIS — Z853 Personal history of malignant neoplasm of breast: Secondary | ICD-10-CM | POA: Diagnosis not present

## 2018-03-19 DIAGNOSIS — I2510 Atherosclerotic heart disease of native coronary artery without angina pectoris: Secondary | ICD-10-CM | POA: Diagnosis not present

## 2018-03-19 DIAGNOSIS — I10 Essential (primary) hypertension: Secondary | ICD-10-CM | POA: Diagnosis not present

## 2018-03-24 MED FILL — JARDIANCE 10 MG TABLET: 10 | 90 days supply | Qty: 90 | Fill #0

## 2018-05-10 MED FILL — CARVEDILOL 25 MG TABLET: 25 | 90 days supply | Qty: 180 | Fill #2

## 2018-05-10 MED FILL — ASPIRIN ADULT LOW STRENGTH: 81 | 90 days supply | Qty: 90 | Fill #0

## 2018-05-10 MED FILL — BRILINTA 90 MG TABLET: 90 | 90 days supply | Qty: 180 | Fill #3

## 2018-05-10 MED FILL — metFORMIN HCL 500 MG TABS: 500 | 90 days supply | Qty: 360 | Fill #0

## 2018-06-25 DIAGNOSIS — E669 Obesity, unspecified: Secondary | ICD-10-CM | POA: Diagnosis not present

## 2018-06-25 DIAGNOSIS — I10 Essential (primary) hypertension: Secondary | ICD-10-CM | POA: Diagnosis not present

## 2018-06-25 DIAGNOSIS — E7800 Pure hypercholesterolemia, unspecified: Secondary | ICD-10-CM | POA: Diagnosis not present

## 2018-06-25 DIAGNOSIS — J0100 Acute maxillary sinusitis, unspecified: Secondary | ICD-10-CM | POA: Diagnosis not present

## 2018-06-25 DIAGNOSIS — E1169 Type 2 diabetes mellitus with other specified complication: Secondary | ICD-10-CM | POA: Diagnosis not present

## 2018-06-25 DIAGNOSIS — Z853 Personal history of malignant neoplasm of breast: Secondary | ICD-10-CM | POA: Diagnosis not present

## 2018-06-25 DIAGNOSIS — K219 Gastro-esophageal reflux disease without esophagitis: Secondary | ICD-10-CM | POA: Diagnosis not present

## 2018-06-25 DIAGNOSIS — R3915 Urgency of urination: Secondary | ICD-10-CM | POA: Diagnosis not present

## 2018-06-25 DIAGNOSIS — I2510 Atherosclerotic heart disease of native coronary artery without angina pectoris: Secondary | ICD-10-CM | POA: Diagnosis not present

## 2018-06-25 MED FILL — LISINOPRIL 5 MG TABLET: 5 | 90 days supply | Qty: 90 | Fill #0

## 2018-06-25 MED FILL — AMOXICILLIN 875 MG TABS: 875 | 10 days supply | Qty: 20 | Fill #0

## 2018-06-25 MED FILL — ATORVASTATIN 40 MG TABLET: 40 | 90 days supply | Qty: 45 | Fill #0

## 2018-06-25 MED FILL — OMEPRAZOLE 20 MG CPDR: 20 | 90 days supply | Qty: 90 | Fill #0

## 2018-07-19 DIAGNOSIS — J018 Other acute sinusitis: Secondary | ICD-10-CM | POA: Diagnosis not present

## 2018-07-19 DIAGNOSIS — J209 Acute bronchitis, unspecified: Secondary | ICD-10-CM | POA: Diagnosis not present

## 2018-07-20 MED FILL — CEFUROXIME AXETIL 500 MG TA: 500 | 10 days supply | Qty: 20 | Fill #0

## 2018-07-30 MED FILL — JARDIANCE 10 MG TABLET: 10 | 90 days supply | Qty: 90 | Fill #1

## 2018-08-05 DIAGNOSIS — R05 Cough: Secondary | ICD-10-CM | POA: Diagnosis not present

## 2018-08-05 MED FILL — AZITHROMYCIN 250 MG TABLET: 250 | 6 days supply | Qty: 6 | Fill #0

## 2018-08-05 MED FILL — BENZONATATE 200 MG CAPS: 200 | 20 days supply | Qty: 60 | Fill #0

## 2018-08-05 MED FILL — LOSARTAN POTASSIUM 25 MG TA: 25 | 90 days supply | Qty: 90 | Fill #0

## 2018-08-06 ENCOUNTER — Other Ambulatory Visit (HOSPITAL_COMMUNITY): Payer: Self-pay | Admitting: Family Medicine

## 2018-08-06 ENCOUNTER — Ambulatory Visit (HOSPITAL_COMMUNITY)
Admission: RE | Admit: 2018-08-06 | Discharge: 2018-08-06 | Disposition: A | Discharge status: Home / Self Care | Payer: 59 | Source: Ambulatory Visit | Attending: Family Medicine | Admitting: Family Medicine

## 2018-08-06 DIAGNOSIS — R059 Cough, unspecified: Secondary | ICD-10-CM

## 2018-08-06 DIAGNOSIS — R918 Other nonspecific abnormal finding of lung field: Secondary | ICD-10-CM | POA: Diagnosis not present

## 2018-08-06 DIAGNOSIS — I2510 Atherosclerotic heart disease of native coronary artery without angina pectoris: Secondary | ICD-10-CM | POA: Diagnosis not present

## 2018-08-06 DIAGNOSIS — R05 Cough: Secondary | ICD-10-CM | POA: Insufficient documentation

## 2018-08-12 ENCOUNTER — Other Ambulatory Visit: Payer: Self-pay | Admitting: Cardiology

## 2018-08-12 MED FILL — CARVEDILOL 25 MG TABLET: 25 | 90 days supply | Qty: 180 | Fill #0

## 2018-08-13 MED FILL — BRILINTA 90 MG TABLET: 90 | 30 days supply | Qty: 60 | Fill #0

## 2018-08-25 DIAGNOSIS — Z853 Personal history of malignant neoplasm of breast: Secondary | ICD-10-CM | POA: Diagnosis not present

## 2018-09-17 DIAGNOSIS — E1169 Type 2 diabetes mellitus with other specified complication: Secondary | ICD-10-CM | POA: Diagnosis not present

## 2018-09-17 DIAGNOSIS — I2510 Atherosclerotic heart disease of native coronary artery without angina pectoris: Secondary | ICD-10-CM | POA: Diagnosis not present

## 2018-09-17 DIAGNOSIS — Z853 Personal history of malignant neoplasm of breast: Secondary | ICD-10-CM | POA: Diagnosis not present

## 2018-09-17 DIAGNOSIS — E7800 Pure hypercholesterolemia, unspecified: Secondary | ICD-10-CM | POA: Diagnosis not present

## 2018-09-17 DIAGNOSIS — R05 Cough: Secondary | ICD-10-CM | POA: Diagnosis not present

## 2018-09-17 DIAGNOSIS — Z23 Encounter for immunization: Secondary | ICD-10-CM | POA: Diagnosis not present

## 2018-09-17 DIAGNOSIS — I10 Essential (primary) hypertension: Secondary | ICD-10-CM | POA: Diagnosis not present

## 2018-09-17 DIAGNOSIS — E669 Obesity, unspecified: Secondary | ICD-10-CM | POA: Diagnosis not present

## 2018-09-17 DIAGNOSIS — K219 Gastro-esophageal reflux disease without esophagitis: Secondary | ICD-10-CM | POA: Diagnosis not present

## 2018-09-17 MED FILL — FLUCONAZOLE 150 MG TABS: 150 | 3 days supply | Qty: 2 | Fill #0

## 2018-09-17 MED FILL — BENZONATATE 200 MG CAPS: 200 | 30 days supply | Qty: 90 | Fill #0

## 2018-09-23 ENCOUNTER — Other Ambulatory Visit: Payer: Self-pay | Admitting: Cardiology

## 2018-09-23 MED FILL — LISINOPRIL 5 MG TABLET: 5 | 90 days supply | Qty: 90 | Fill #1

## 2018-09-23 MED FILL — OMEPRAZOLE 20 MG CPDR: 20 | 90 days supply | Qty: 90 | Fill #1

## 2018-09-23 MED FILL — ATORVASTATIN CALCIUM 40 MG: 40 | 90 days supply | Qty: 45 | Fill #1

## 2018-10-27 DIAGNOSIS — Z1231 Encounter for screening mammogram for malignant neoplasm of breast: Secondary | ICD-10-CM | POA: Diagnosis not present

## 2018-10-29 ENCOUNTER — Other Ambulatory Visit: Payer: Self-pay | Admitting: Cardiology

## 2018-10-29 MED FILL — LOSARTAN POTASSIUM 25 MG TA: 25 | 30 days supply | Qty: 30 | Fill #1

## 2018-10-29 MED FILL — JARDIANCE 10 MG TABLET: 10 | 90 days supply | Qty: 90 | Fill #0

## 2018-10-29 MED FILL — BRILINTA 90 MG TABLET: 90 | 30 days supply | Qty: 60 | Fill #0

## 2018-11-01 MED FILL — FLUCONAZOLE 150 MG TABS: 150 | 2 days supply | Qty: 2 | Fill #0

## 2018-11-05 ENCOUNTER — Encounter (INDEPENDENT_AMBULATORY_CARE_PROVIDER_SITE_OTHER): Payer: Self-pay

## 2018-11-05 ENCOUNTER — Ambulatory Visit: Payer: 59 | Admitting: Cardiology

## 2018-11-05 ENCOUNTER — Encounter: Payer: Self-pay | Admitting: Cardiology

## 2018-11-05 VITALS — BP 120/74 | HR 73 | Ht 66.0 in | Wt 191.0 lb

## 2018-11-05 DIAGNOSIS — I10 Essential (primary) hypertension: Secondary | ICD-10-CM

## 2018-11-05 DIAGNOSIS — I2510 Atherosclerotic heart disease of native coronary artery without angina pectoris: Secondary | ICD-10-CM | POA: Diagnosis not present

## 2018-11-05 DIAGNOSIS — E785 Hyperlipidemia, unspecified: Secondary | ICD-10-CM | POA: Diagnosis not present

## 2018-11-05 DIAGNOSIS — E118 Type 2 diabetes mellitus with unspecified complications: Secondary | ICD-10-CM | POA: Diagnosis not present

## 2018-11-05 DIAGNOSIS — I252 Old myocardial infarction: Secondary | ICD-10-CM

## 2018-11-05 DIAGNOSIS — E1169 Type 2 diabetes mellitus with other specified complication: Secondary | ICD-10-CM | POA: Diagnosis not present

## 2018-11-05 MED ORDER — TICAGRELOR 60 MG PO TABS: 60.0000 mg | ORAL_TABLET | Freq: Two times a day (BID) | ORAL | 3 refills | Status: DC

## 2018-11-05 MED FILL — BRILINTA 60 MG TABLET: 60 | 90 days supply | Qty: 180 | Fill #0

## 2018-11-05 NOTE — Patient Instructions (Signed)
Medication Instructions:    STOP TAKING ASPIRIN 81 MG  COMPLETE 90 MG BRILINTA  ,THEN START 60 MG OF BRILINTA TWICE A DAY If you need a refill on your cardiac medications before your next appointment, please call your pharmacy.   Lab work: NOT NEEDED If you have labs (blood work) drawn today and your tests are completely normal, you will receive your results only by: Marland Kitchen MyChart Message (if you have MyChart) OR . A paper copy in the mail If you have any lab test that is abnormal or we need to change your treatment, we will call you to review the results.  Testing/Procedures: NOT NEEDED  Follow-Up: At [facility], you and your health needs are our priority.  As part of our continuing mission to provide you with exceptional heart care, we have created designated Provider Care Teams.  These Care Teams include your primary Cardiologist (physician) and Advanced Practice Providers (APPs -  Physician Assistants and Nurse Practitioners) who all work together to provide you with the care you need, when you need it. You will need a follow up appointment in 12 months JAN 2021.  Please call our office 2 months in advance to schedule this appointment.  You may see Glenetta Hew, MDor one of the following Advanced Practice Providers on your designated Care Team:   Rosaria Ferries, PA-C . Jory Sims, DNP, ANP  Any Other Special Instructions Will Be Listed Below (If Applicable).

## 2018-11-05 NOTE — Progress Notes (Signed)
PCP: Lois Huxley, PA  Clinic Note: Chief Complaint  Patient presents with  . Follow-up    Delayed annual follow-up.  Scheduling issues  . Coronary Artery Disease    No further angina    HPI: Cindy Weber is a 58 y.o. female with a PMH below who presents today for delayed annual (~15 month) f/u for CAD. She has moved from [facility] where she is being cared for by Dr. Redmond Pulling of [facility] in [place].  Per report, she had a history of MI with 2 stents back in 2014. She states aspirin and Brilinta along with carvedilol. She takes 80 mg atorvastatin for lipids.  Cindy Weber was last seen in Sept 2018 to establish a local cardiologist.   No cardiac complaints.  No angina since 2014 MI.  Prior to that would have intermittent end of day episodes of CP when she would rest in the evenings.  She only noted intermittent R shoulder pain for the last few years - different from MI pain.   Recent Hospitalizations: none  Studies Personally Reviewed - (if available, images/films reviewed: From Epic Chart or Care Everywhere)  none  Interval History: Cindy Weber returns today doing quite well.  She does not do routine exercise, but tries to "get in a lot of walking" at work.  She still denies any recurrent MI/angina type chest pain or dyspnea at rest or with exertion.  She still has the intermittent R upper chest/shoulder pain that comes & goes fleetingly -- described as a "nagging" sharp pain that last only a minute or two.    Otherwise denies any notable CV symptoms: No PND, orthopnea or edema.  No palpitations, lightheadedness, dizziness, weakness or syncope/near syncope. No TIA/amaurosis fugax symptoms. No melena, hematochezia, hematuria, or epstaxis. No claudication.  Smoking: down to ~ 1/day, but not yet ready to quit.  ROS: A comprehensive was performed. Review of Systems  Constitutional: Negative for malaise/fatigue and weight loss.  HENT: Negative for congestion.     Respiratory: Negative for cough and shortness of breath.   Gastrointestinal: Negative for abdominal pain and heartburn.  Musculoskeletal: Negative for joint pain and myalgias.  Neurological: Negative for dizziness and headaches.  Psychiatric/Behavioral: The patient is not nervous/anxious and does not have insomnia.   All other systems reviewed and are negative.  I have reviewed and (if needed) personally updated the patient's problem list, medications, allergies, past medical and surgical history, social and family history.   Past Medical History:  Diagnosis Date  . Coronary artery disease involving native heart with angina pectoris ([place]) 02/2013   Non-STEMI - 100% CTO RCA (small codominant posterior and.; Normal LAD. The distal circumflex 95% stenosis - PCI with DES 2 (Xience expedition stents overlapping -2.25 mm x 15 mm and 2.5 mm x 23 mm both postdilated 2.5 mm balloon)  . DM (diabetes mellitus), type 2 with complications (HCC)    CAD - MI.   . Essential hypertension   . Hyperlipidemia associated with type 2 diabetes mellitus ([place])   . Non-STEMI (non-ST elevated myocardial infarction) ([place]) 2014   PCI - (2 stents) -formerly cared for by Dr. Redmond Pulling [facility].    Past Surgical History:  Procedure Laterality Date  . BREAST LUMPECTOMY Left 2014  . CARDIAC CATHETERIZATION  02/15/2013   [facility] (Dr. Daiva Huge): Codominant system.  100% CTO of small RCA.  d Cx 95%, normal LAD. -> PCI of dCx  . LEFT OOPHORECTOMY  1998  . PERCUTANEOUS CORONARY STENT  INTERVENTION (PCI-S)  02/16/2013   [facility] -(Dr. Daiva Huge): PCI [place] 95%,- Xience expedition DES 2.25 mm x 15 mm overlap approximately with a Xience exhibition DES 2.5 mm x 23 mm -postdilated to 2.5 mm throughout  . TRANSTHORACIC ECHOCARDIOGRAM  02/15/2013   [place]: Normal LV Size & function. EF ~55-60%.  GR1DD. Mild MR & LAE.     02/15/2013: CTA chest -([facility]): No PE. No cardiomyopathy. Mild-moderate centrilobular and  paraseptal emphysema. Few tiny groundglass nodules.   Current Meds  Medication Sig  . atorvastatin (LIPITOR) 80 MG tablet Take 1 tablet by mouth daily.  . carvedilol (COREG) 25 MG tablet Take 1 tablet (25 mg total) by mouth 2 (two) times daily.  . cyanocobalamin (V-R VITAMIN B-12) 500 MCG tablet Take 1 tablet by mouth daily.  . Ibuprofen-Famotidine (DUEXIS) 800-26.6 MG TABS Take 1 tablet by mouth 2 (two) times daily as needed.  Marland Kitchen JARDIANCE 10 MG TABS tablet   . letrozole (FEMARA) 2.5 MG tablet Take 1 tablet by mouth daily.  Marland Kitchen losartan (COZAAR) 25 MG tablet   . metFORMIN (GLUCOPHAGE) 500 MG tablet Take 1 tablet by mouth 2 (two) times daily.  Marland Kitchen omeprazole (PRILOSEC) 20 MG capsule Take 1 capsule by mouth daily.  . [DISCONTINUED] BRILINTA 90 MG TABS tablet Take 1 tablet (90 mg total) by mouth 2 (two) times daily. KEEP OV.  . [DISCONTINUED] lisinopril (PRINIVIL,ZESTRIL) 5 MG tablet Take 1 tablet (5 mg total) by mouth daily.    No Known Allergies  Social History   Tobacco Use  . Smoking status: Current Every Day Smoker    Types: Cigarettes  . Smokeless tobacco: Never Used  Substance Use Topics  . Alcohol use: Not on file  . Drug use: Not on file   Social History   Social History Narrative   She is married to [place] who is a retired Network engineer. She smokes about half pack a day and has done so for many years. No recreational drug use. Rare alcohol use.   They 3 children aged 78, 52 and 44.    She currently works as a Chartered certified accountant for [employer]    family history includes Diabetes in her brother, maternal grandfather, mother, sister, son, and son; Healthy in her sister; Heart attack in her brother; Heart disease in her brother and mother; Hypertension in her brother, brother, daughter, sister, and son; Liver disease in her father.  Wt Readings from Last 3 Encounters:  11/05/18 191 lb (86.6 kg)  07/02/17 197 lb 12.8 oz (89.7 kg)    PHYSICAL EXAM BP 120/74   Pulse 73   Ht 5'  6" (1.676 m)   Wt 191 lb (86.6 kg)   BMI 30.83 kg/m  Physical Exam  Constitutional: She is oriented to person, place, and time. She appears well-developed and well-nourished. No distress.  Mildly obese  HENT:  Head: Normocephalic and atraumatic.  Neck: Normal range of motion. Neck supple. No hepatojugular reflux and no JVD present. Carotid bruit is not present.  Cardiovascular: Normal rate, regular rhythm, normal heart sounds and normal pulses.  No extrasystoles are present. PMI is not displaced. Exam reveals no gallop and no friction rub.  No murmur heard. Pulmonary/Chest: Effort normal and breath sounds normal. No respiratory distress. She has no wheezes. She has no rales.  Abdominal: Soft. Bowel sounds are normal. She exhibits no distension. There is no abdominal tenderness. There is no rebound.  No HSM  Musculoskeletal: Normal range of motion.  General: No edema (trivial).  Neurological: She is alert and oriented to person, place, and time.  Psychiatric: She has a normal mood and affect. Her behavior is normal. Judgment and thought content normal.  Vitals reviewed.   Adult ECG Report  Rate: 73 ;  Rhythm: normal sinus rhythm and Non-specific ST-T changes in lateral leads.  Otherwise normal axis, intervals and durations.;   Narrative Interpretation: Relatively normal EKG   Other studies Reviewed: Additional studies/ records that were reviewed today include:  Recent Labs:  No results found for: CHOL, HDL, LDLCALC, LDLDIRECT, TRIG, CHOLHDL   From K PN dated 06/25/2018: TC 133, TG 218, LDL 65 and HDL 24.  ASSESSMENT / PLAN: Problem List Items Addressed This Visit    Coronary artery disease involving native heart without angina pectoris - Primary (Chronic)    No recurrent angina since her PCI back in 2014.  I do not think her right-sided shoulder discomfort is at all anginal in nature. She is on aspirin plus treatment dose of Brilinta along with beta-blocker statin and  ARB. Plan: Okay to stop aspirin and reduce Brilinta to 60 mg twice daily. --At this point it is okay to hold Brilinta for procedures.      Relevant Medications   losartan (COZAAR) 25 MG tablet   Other Relevant Orders   EKG 12-Lead   DM (diabetes mellitus), type 2 with complications (HCC) (Chronic)    Is now on metformin plus Jardiance with an A1c of 6.1.      Relevant Medications   JARDIANCE 10 MG TABS tablet   losartan (COZAAR) 25 MG tablet   Other Relevant Orders   EKG 12-Lead   Essential hypertension (Chronic)    Blood pressure well controlled on current regimen.  Is on high-dose carvedilol and low-dose losartan.      Relevant Medications   losartan (COZAAR) 25 MG tablet   Other Relevant Orders   EKG 12-Lead   History of MI (myocardial infarction) (Chronic)    Ruled in for what probably was inferior "nonST elevation MI" back in 2014 (occluded nondominant RCA with severe distal circumflex disease treated with 2 overlapping DES stents).  Echocardiogram showed relatively preserved EF with no significant wall motion abnormality.  No symptoms of heart failure symptoms since.      Hyperlipidemia associated with type 2 diabetes mellitus (HCC) (Chronic)    Lipids look pretty well controlled with exception of HDL being low and triglycerides being somewhat elevated.  Seems to be tolerating atorvastatin at current dose.  However if HDL continues to be low and triglycerides continue be elevated, would consider adding Vascepa      Relevant Medications   JARDIANCE 10 MG TABS tablet   losartan (COZAAR) 25 MG tablet   Other Relevant Orders   EKG 12-Lead      I spent a total of 25 minutes with the patient and chart review. >  50% of the time was spent in direct patient consultation.   Current medicines are reviewed at length with the patient today.  (+/- concerns) none The following changes have been made:  Okay to stop aspirin and reduce Brilinta to maintenance dose 60 mg twice  daily  Patient Instructions  Medication Instructions:    STOP TAKING ASPIRIN 81 MG  COMPLETE 90 MG BRILINTA  ,THEN START 60 MG OF BRILINTA TWICE A DAY If you need a refill on your cardiac medications before your next appointment, please call your pharmacy.   Lab work: NOT NEEDED If you  have labs (blood work) drawn today and your tests are completely normal, you will receive your results only by: Marland Kitchen MyChart Message (if you have MyChart) OR . A paper copy in the mail If you have any lab test that is abnormal or we need to change your treatment, we will call you to review the results.  Testing/Procedures: NOT NEEDED  Follow-Up: At [facility], you and your health needs are our priority.  As part of our continuing mission to provide you with exceptional heart care, we have created designated Provider Care Teams.  These Care Teams include your primary Cardiologist (physician) and Advanced Practice Providers (APPs -  Physician Assistants and Nurse Practitioners) who all work together to provide you with the care you need, when you need it. You will need a follow up appointment in 12 months JAN 2021.  Please call our office 2 months in advance to schedule this appointment.  You may see Glenetta Hew, MDor one of the following Advanced Practice Providers on your designated Care Team:   Rosaria Ferries, PA-C . Jory Sims, DNP, ANP  Any Other Special Instructions Will Be Listed Below (If Applicable).     Studies Ordered:   Orders Placed This Encounter  Procedures  . EKG 12-Lead      Glenetta Hew, M.D., M.S. Interventional Cardiologist   Pager # [phone] Phone # [phone] [address] [place], [place] [address]   Thank you for choosing Heartcare at [facility]!!

## 2018-11-07 ENCOUNTER — Encounter: Payer: Self-pay | Admitting: Cardiology

## 2018-11-07 NOTE — Assessment & Plan Note (Signed)
No recurrent angina since her PCI back in 2014.  I do not think her right-sided shoulder discomfort is at all anginal in nature. She is on aspirin plus treatment dose of Brilinta along with beta-blocker statin and ARB. Plan: Okay to stop aspirin and reduce Brilinta to 60 mg twice daily. --At this point it is okay to hold Brilinta for procedures.

## 2018-11-07 NOTE — Assessment & Plan Note (Signed)
Blood pressure well controlled on current regimen.  Is on high-dose carvedilol and low-dose losartan.

## 2018-11-07 NOTE — Assessment & Plan Note (Signed)
Lipids look pretty well controlled with exception of HDL being low and triglycerides being somewhat elevated.  Seems to be tolerating atorvastatin at current dose.  However if HDL continues to be low and triglycerides continue be elevated, would consider adding Vascepa

## 2018-11-07 NOTE — Assessment & Plan Note (Signed)
Ruled in for what probably was inferior "nonST elevation MI" back in 2014 (occluded nondominant RCA with severe distal circumflex disease treated with 2 overlapping DES stents).  Echocardiogram showed relatively preserved EF with no significant wall motion abnormality.  No symptoms of heart failure symptoms since.

## 2018-11-07 NOTE — Assessment & Plan Note (Signed)
Is now on metformin plus Jardiance with an A1c of 6.1.

## 2018-11-18 MED FILL — CARVEDILOL 25 MG TABLET: 25 | 90 days supply | Qty: 180 | Fill #1

## 2018-12-09 MED FILL — LOSARTAN POTASSIUM 25 MG TA: 25 | 30 days supply | Qty: 30 | Fill #2

## 2018-12-21 DIAGNOSIS — E119 Type 2 diabetes mellitus without complications: Secondary | ICD-10-CM | POA: Diagnosis not present

## 2018-12-21 DIAGNOSIS — H25813 Combined forms of age-related cataract, bilateral: Secondary | ICD-10-CM | POA: Diagnosis not present

## 2018-12-24 MED FILL — ATORVASTATIN 40 MG TABLET: 40 | 90 days supply | Qty: 45 | Fill #2

## 2019-01-06 MED FILL — LOSARTAN POTASSIUM 25 MG TA: 25 | 30 days supply | Qty: 30 | Fill #3

## 2019-01-06 MED FILL — BRILINTA 60 MG TABLET: 60 | 90 days supply | Qty: 180 | Fill #1

## 2019-01-07 DIAGNOSIS — K219 Gastro-esophageal reflux disease without esophagitis: Secondary | ICD-10-CM | POA: Diagnosis not present

## 2019-01-07 DIAGNOSIS — I2510 Atherosclerotic heart disease of native coronary artery without angina pectoris: Secondary | ICD-10-CM | POA: Diagnosis not present

## 2019-01-07 DIAGNOSIS — F17200 Nicotine dependence, unspecified, uncomplicated: Secondary | ICD-10-CM | POA: Diagnosis not present

## 2019-01-07 DIAGNOSIS — E1169 Type 2 diabetes mellitus with other specified complication: Secondary | ICD-10-CM | POA: Diagnosis not present

## 2019-01-07 DIAGNOSIS — Z853 Personal history of malignant neoplasm of breast: Secondary | ICD-10-CM | POA: Diagnosis not present

## 2019-01-07 DIAGNOSIS — I10 Essential (primary) hypertension: Secondary | ICD-10-CM | POA: Diagnosis not present

## 2019-01-07 DIAGNOSIS — E669 Obesity, unspecified: Secondary | ICD-10-CM | POA: Diagnosis not present

## 2019-01-07 DIAGNOSIS — E7800 Pure hypercholesterolemia, unspecified: Secondary | ICD-10-CM | POA: Diagnosis not present

## 2019-01-07 MED FILL — FLUCONAZOLE 150 MG TABS: 150 | 3 days supply | Qty: 2 | Fill #0

## 2019-01-07 MED FILL — JARDIANCE 10 MG TABLET: 10 | 90 days supply | Qty: 90 | Fill #0

## 2019-01-07 MED FILL — metFORMIN HCL 500 MG TABS: 500 | 90 days supply | Qty: 360 | Fill #0

## 2019-02-23 MED FILL — ATORVASTATIN 20 MG TABLET: 20 | 90 days supply | Qty: 90 | Fill #0

## 2019-02-23 MED FILL — OMEPRAZOLE 20 MG CPDR: 20 | 90 days supply | Qty: 90 | Fill #0

## 2019-02-23 MED FILL — CARVEDILOL 25 MG TABLET: 25 | 90 days supply | Qty: 180 | Fill #0

## 2019-03-04 MED FILL — FLUCONAZOLE 150 MG TABS: 150 | 3 days supply | Qty: 2 | Fill #1

## 2019-03-21 ENCOUNTER — Emergency Department (HOSPITAL_COMMUNITY)
Admission: EM | Admit: 2019-03-21 | Discharge: 2019-03-21 | Disposition: A | Discharge status: Home / Self Care | Payer: 59 | Attending: Emergency Medicine | Admitting: Emergency Medicine

## 2019-03-21 ENCOUNTER — Other Ambulatory Visit: Payer: Self-pay

## 2019-03-21 ENCOUNTER — Emergency Department (HOSPITAL_COMMUNITY): Payer: 59

## 2019-03-21 DIAGNOSIS — F17210 Nicotine dependence, cigarettes, uncomplicated: Secondary | ICD-10-CM | POA: Insufficient documentation

## 2019-03-21 DIAGNOSIS — G510 Bell's palsy: Secondary | ICD-10-CM | POA: Diagnosis not present

## 2019-03-21 DIAGNOSIS — I2510 Atherosclerotic heart disease of native coronary artery without angina pectoris: Secondary | ICD-10-CM | POA: Insufficient documentation

## 2019-03-21 DIAGNOSIS — E119 Type 2 diabetes mellitus without complications: Secondary | ICD-10-CM | POA: Diagnosis not present

## 2019-03-21 DIAGNOSIS — I10 Essential (primary) hypertension: Secondary | ICD-10-CM | POA: Insufficient documentation

## 2019-03-21 DIAGNOSIS — Z79899 Other long term (current) drug therapy: Secondary | ICD-10-CM | POA: Insufficient documentation

## 2019-03-21 DIAGNOSIS — I700 Atherosclerosis of aorta: Secondary | ICD-10-CM | POA: Diagnosis not present

## 2019-03-21 DIAGNOSIS — Z7984 Long term (current) use of oral hypoglycemic drugs: Secondary | ICD-10-CM | POA: Diagnosis not present

## 2019-03-21 DIAGNOSIS — R29810 Facial weakness: Secondary | ICD-10-CM | POA: Diagnosis not present

## 2019-03-21 DIAGNOSIS — I252 Old myocardial infarction: Secondary | ICD-10-CM | POA: Insufficient documentation

## 2019-03-21 LAB — CBC
HCT: 45.9 % (ref 36.0–46.0)
Hemoglobin: 14.9 g/dL (ref 12.0–15.0)
MCH: 29.4 pg (ref 26.0–34.0)
MCHC: 32.5 g/dL (ref 30.0–36.0)
MCV: 90.7 fL (ref 80.0–100.0)
Platelets: 211 10*3/uL (ref 150–400)
RBC: 5.06 MIL/uL (ref 3.87–5.11)
RDW: 14.1 % (ref 11.5–15.5)
WBC: 8 10*3/uL (ref 4.0–10.5)
nRBC: 0 % (ref 0.0–0.2)

## 2019-03-21 LAB — I-STAT CHEM 8, ED
BUN: 14 mg/dL (ref 6–20)
Calcium, Ion: 1.1 mmol/L — ABNORMAL LOW (ref 1.15–1.40)
Chloride: 109 mmol/L (ref 98–111)
Creatinine, Ser: 0.7 mg/dL (ref 0.44–1.00)
Glucose, Bld: 147 mg/dL — ABNORMAL HIGH (ref 70–99)
HCT: 46 % (ref 36.0–46.0)
Hemoglobin: 15.6 g/dL — ABNORMAL HIGH (ref 12.0–15.0)
Potassium: 4.1 mmol/L (ref 3.5–5.1)
Sodium: 138 mmol/L (ref 135–145)
TCO2: 22 mmol/L (ref 22–32)

## 2019-03-21 LAB — COMPREHENSIVE METABOLIC PANEL
ALT: 21 U/L (ref 0–44)
AST: 15 U/L (ref 15–41)
Albumin: 4.1 g/dL (ref 3.5–5.0)
Alkaline Phosphatase: 50 U/L (ref 38–126)
Anion gap: 11 (ref 5–15)
BUN: 12 mg/dL (ref 6–20)
CO2: 19 mmol/L — ABNORMAL LOW (ref 22–32)
Calcium: 9.7 mg/dL (ref 8.9–10.3)
Chloride: 109 mmol/L (ref 98–111)
Creatinine, Ser: 0.81 mg/dL (ref 0.44–1.00)
GFR calc Af Amer: >60 mL/min (ref >60)
GFR calc non Af Amer: >60 mL/min (ref >60)
Glucose, Bld: 149 mg/dL — ABNORMAL HIGH (ref 70–99)
Potassium: 4.2 mmol/L (ref 3.5–5.1)
Sodium: 139 mmol/L (ref 135–145)
Total Bilirubin: 1 mg/dL (ref 0.3–1.2)
Total Protein: 7.5 g/dL (ref 6.5–8.1)

## 2019-03-21 LAB — DIFFERENTIAL
Abs Immature Granulocytes: 0.04 10*3/uL (ref 0.00–0.07)
Basophils Absolute: 0 10*3/uL (ref 0.0–0.1)
Basophils Relative: 0 %
Eosinophils Absolute: 0.2 10*3/uL (ref 0.0–0.5)
Eosinophils Relative: 3 %
Immature Granulocytes: 1 %
Lymphocytes Relative: 31 %
Lymphs Abs: 2.5 10*3/uL (ref 0.7–4.0)
Monocytes Absolute: 0.7 10*3/uL (ref 0.1–1.0)
Monocytes Relative: 8 %
Neutro Abs: 4.6 10*3/uL (ref 1.7–7.7)
Neutrophils Relative %: 57 %

## 2019-03-21 LAB — GLUCOSE, CAPILLARY: Glucose-Capillary: 124 mg/dL — ABNORMAL HIGH (ref 70–99)

## 2019-03-21 LAB — I-STAT BETA HCG BLOOD, ED (MC, WL, AP ONLY): I-stat hCG, quantitative: <5 m[IU]/mL (ref <5)

## 2019-03-21 LAB — PROTIME-INR
INR: 1 (ref 0.8–1.2)
Prothrombin Time: 12.8 seconds (ref 11.4–15.2)

## 2019-03-21 LAB — APTT: aPTT: 25 seconds (ref 24–36)

## 2019-03-21 MED ORDER — ARTIFICIAL TEARS OPHTHALMIC OINT: TOPICAL_OINTMENT | Freq: Every evening | OPHTHALMIC | 0 refills | Status: DC | PRN

## 2019-03-21 MED ORDER — PREDNISONE 20 MG PO TABS: ORAL_TABLET | ORAL | 0 refills | Status: DC

## 2019-03-21 MED ORDER — VALACYCLOVIR HCL 1 G PO TABS: 1000.0000 mg | ORAL_TABLET | Freq: Three times a day (TID) | ORAL | 0 refills | Status: DC

## 2019-03-21 MED ORDER — SODIUM CHLORIDE 0.9% FLUSH: 3.0000 mL | Freq: Once | INTRAVENOUS | Status: DC

## 2019-03-21 MED ORDER — PREDNISONE 20 MG PO TABS
60.0000 mg | ORAL_TABLET | Freq: Once | ORAL | Status: AC
  Administered 2019-03-21: 60 mg via ORAL
  Filled 2019-03-21: qty 3

## 2019-03-21 MED ORDER — VALACYCLOVIR HCL 500 MG PO TABS
1000.0000 mg | ORAL_TABLET | Freq: Once | ORAL | Status: AC
  Administered 2019-03-21: 1000 mg via ORAL
  Filled 2019-03-21: qty 2

## 2019-03-21 MED FILL — predniSONE 20 MG TABS: 20 | 12 days supply | Qty: 21 | Fill #0

## 2019-03-21 MED FILL — valACYclovir HCL 1 GM TABS: 1 | 6 days supply | Qty: 20 | Fill #0

## 2019-03-21 NOTE — ED Provider Notes (Signed)
[facility] Provider Note   CSN: [id number] Arrival date & time: 03/21/19  0737    History   Chief Complaint Chief Complaint  Patient presents with   Code Stroke    HPI Cindy Weber is a 58 y.o. female.     HPI  58 year old female presents with sudden onset facial droop and trouble gargling.  She states that she woke up fine about 445.  Around 545 she was brushing her teeth and it felt like she could not gargle like normal.  She is been having some eye watering over the last couple days and transiently had some right posterior jaw pain a couple days ago.  No headache.  No weakness or numbness in her extremities.  She states when she woke up she felt fine and drink coffee without difficulty. Code stroke called.  Past Medical History:  Diagnosis Date   Coronary artery disease involving native heart with angina pectoris ([place]) 02/2013   Non-STEMI - 100% CTO RCA (small codominant posterior and.; Normal LAD. The distal circumflex 95% stenosis - PCI with DES 2 (Xience expedition stents overlapping -2.25 mm x 15 mm and 2.5 mm x 23 mm both postdilated 2.5 mm balloon)   DM (diabetes mellitus), type 2 with complications (HCC)    CAD - MI.    Essential hypertension    Hyperlipidemia associated with type 2 diabetes mellitus ([place])    Non-STEMI (non-ST elevated myocardial infarction) ([place]) 2014   PCI - (2 stents) -formerly cared for by Dr. Redmond Pulling [facility].    Patient Active Problem List   Diagnosis Date Noted   DM (diabetes mellitus), type 2 with complications ([place])    Essential hypertension    Hyperlipidemia associated with type 2 diabetes mellitus ([place])    Coronary artery disease involving native heart without angina pectoris 10/13/2012   History of MI (myocardial infarction) 10/13/2012    Past Surgical History:  Procedure Laterality Date   BREAST LUMPECTOMY Left 2014   CARDIAC CATHETERIZATION  02/15/2013   [facility] (Dr.  Daiva Huge): Codominant system.  100% CTO of small RCA.  d Cx 95%, normal LAD. -> PCI of dCx   LEFT OOPHORECTOMY  1998   PERCUTANEOUS CORONARY STENT INTERVENTION (PCI-S)  02/16/2013   [facility] -(Dr. Daiva Huge): PCI dCX 95%,- Xience expedition DES 2.25 mm x 15 mm overlap approximately with a Xience exhibition DES 2.5 mm x 23 mm -postdilated to 2.5 mm throughout   TRANSTHORACIC ECHOCARDIOGRAM  02/15/2013   [facility]: Normal LV Size & function. EF ~55-60%.  GR1DD. Mild MR & LAE.      OB History   No obstetric history on file.      Home Medications    Prior to Admission medications   Medication Sig Start Date End Date Taking? Authorizing Provider  atorvastatin (LIPITOR) 80 MG tablet Take 1 tablet by mouth daily. 10/10/15   [provider]  carvedilol (COREG) 25 MG tablet Take 1 tablet (25 mg total) by mouth 2 (two) times daily. 07/02/17   Leonie Man, MD  cyanocobalamin (V-R VITAMIN B-12) 500 MCG tablet Take 1 tablet by mouth daily.    [provider]  Ibuprofen-Famotidine (DUEXIS) 800-26.6 MG TABS Take 1 tablet by mouth 2 (two) times daily as needed. 08/05/17   Meredith Pel, MD  JARDIANCE 10 MG TABS tablet  10/29/18   [provider]  letrozole [facility]) 2.5 MG tablet Take 1 tablet by mouth daily. 06/22/17   [provider]  losartan (COZAAR) 25 MG tablet  10/29/18   [provider]  metFORMIN (GLUCOPHAGE) 500 MG tablet Take 1 tablet by mouth 2 (two) times daily. 01/07/17   [provider]  omeprazole (PRILOSEC) 20 MG capsule Take 1 capsule by mouth daily. 05/14/17   [provider]  ticagrelor (BRILINTA) 60 MG TABS tablet Take 1 tablet (60 mg total) by mouth 2 (two) times daily. 11/05/18   Leonie Man, MD    Family History Family History  Problem Relation Age of Onset   Diabetes Mother    Heart disease Mother    Liver disease Father    Healthy Sister    Hypertension Brother    Diabetes Maternal Grandfather     Diabetes Sister    Hypertension Sister    Heart disease Brother    Heart attack Brother    Diabetes Brother    Hypertension Brother    Hypertension Daughter    Hypertension Son    Diabetes Son    Diabetes Son     Social History Social History   Tobacco Use   Smoking status: Current Every Day Smoker    Types: Cigarettes   Smokeless tobacco: Never Used  Substance Use Topics   Alcohol use: Not on file   Drug use: Not on file     Allergies   Patient has no known allergies.   Review of Systems Review of Systems  Constitutional: Negative for fever.  Cardiovascular: Negative for chest pain.  Neurological: Negative for weakness, numbness and headaches.  All other systems reviewed and are negative.    Physical Exam Updated Vital Signs There were no vitals taken for this visit.  Physical Exam Vitals signs and nursing note reviewed.  Constitutional:      General: She is not in acute distress.    Appearance: She is well-developed. She is not ill-appearing or diaphoretic.  HENT:     Head: Normocephalic and atraumatic.     Right Ear: Tympanic membrane and external ear normal.     Left Ear: Tympanic membrane and external ear normal.     Nose: Nose normal.  Eyes:     General:        Right eye: No discharge.        Left eye: No discharge.  Cardiovascular:     Rate and Rhythm: Normal rate and regular rhythm.     Heart sounds: Normal heart sounds.  Pulmonary:     Effort: Pulmonary effort is normal.     Breath sounds: Normal breath sounds.  Abdominal:     Palpations: Abdomen is soft.     Tenderness: There is no abdominal tenderness.  Skin:    General: Skin is warm and dry.  Neurological:     Mental Status: She is alert.     Comments: No overt facial droop but has flattening on right nasolabial fold. Right forehead does not move as much as left. Right eyelid not as strong at closing as left. Normal sensation. No slurred speech. 5/5 strength in all 4  extremities. Grossly normal sensation. Normal finger to nose.   Psychiatric:        Mood and Affect: Mood is not anxious.      ED Treatments / Results  Labs (all labs ordered are listed, but only abnormal results are displayed) Labs Reviewed  I-STAT CHEM 8, ED - Abnormal; Notable for the following components:      Result Value   Glucose, Bld 147 (*)  Calcium, Ion 1.10 (*)    Hemoglobin 15.6 (*)    All other components within normal limits  PROTIME-INR  APTT  CBC  DIFFERENTIAL  COMPREHENSIVE METABOLIC PANEL  I-STAT BETA HCG BLOOD, ED ([facility], [facility], [facility] ONLY)  CBG MONITORING, ED    EKG EKG Interpretation  Date/Time:  Monday March 21 2019 08:18:31 EDT Ventricular Rate:  81 PR Interval:    QRS Duration: 89 QT Interval:  381 QTC Calculation: 443 R Axis:   -12 Text Interpretation:  Sinus rhythm LVH by voltage No old tracing to compare Confirmed by Sherwood Gambler [phone]) on 03/21/2019 8:28:42 AM   Radiology Ct Head Code Stroke Wo Contrast  Result Date: 03/21/2019 CLINICAL DATA:  Code stroke.  Right facial weakness. EXAM: CT HEAD WITHOUT CONTRAST TECHNIQUE: Contiguous axial images were obtained from the base of the skull through the vertex without intravenous contrast. COMPARISON:  None. FINDINGS: Brain: No acute infarct, hemorrhage, or mass lesion is present. Basal ganglia are intact. Insular ribbon is normal. No acute or focal cortical abnormalities are present. No significant white matter lesions are present. Basal ganglia calcifications are incidentally noted. The brainstem and cerebellum are within normal limits. Vascular: Atherosclerotic calcifications are present in the distal cavernous internal carotid arteries bilaterally. There is no asymmetric hyperdense vessel. Skull: Calvarium is intact. No focal lytic or blastic lesions are present. Sinuses/Orbits: The paranasal sinuses and mastoid air cells are clear. The globes and orbits are within normal limits. ASPECTS [facility] Stroke  Program Early CT Score) - Ganglionic level infarction (caudate, lentiform nuclei, internal capsule, insula, M1-M3 cortex): 7/7 - Supraganglionic infarction (M4-M6 cortex): 3/3 Total score (0-10 with 10 being normal): 10/10 IMPRESSION: 1. Normal CT appearance of brain. 2. Atherosclerosis. 3. ASPECTS is 10/10 The above was relayed via text pager to Dr. Leonel Ramsay on 03/21/2019 at 08:02 . Electronically Signed   By: San Morelle M.D.   On: 03/21/2019 08:02    Procedures Procedures (including critical care time)  Medications Ordered in ED Medications  sodium chloride flush (NS) 0.9 % injection 3 mL (has no administration in time range)  predniSONE (DELTASONE) tablet 60 mg (has no administration in time range)  valACYclovir (VALTREX) tablet 1,000 mg (has no administration in time range)     Initial Impression / Assessment and Plan / ED Course  I have reviewed the triage vital signs and the nursing notes.  Pertinent labs & imaging results that were available during my care of the patient were reviewed by me and considered in my medical decision making (see chart for details).        After CT head, further more detailed exam performed by neurology myself and it appears this is probably more mild Bell's palsy.  There are no extremity signs or symptoms.  Code stroke canceled.  Patient does have diabetes but will still need prednisone and I discussed that her glucose will likely go up over the next several days.  Given valacyclovir as well. Labs are unremarkable besides mild hyperglycemia. Temp 98.3. otherwise appears stable for d/c home.  Final Clinical Impressions(s) / ED Diagnoses   Final diagnoses:  Right-sided Bell's palsy    ED Discharge Orders    None       Sherwood Gambler, MD 03/21/19 [phone]

## 2019-03-21 NOTE — ED Notes (Signed)
[facility] called to cancel code stroke per Dr Leonel Ramsay

## 2019-03-21 NOTE — Consult Note (Signed)
Neurology Consultation Reason for Consult: Facial weakness Referring Physician: Lula Olszewski  CC: Facial weakness  History is obtained from: Patient  HPI: Cindy Weber is a 58 y.o. female with a history of CAD, DM, hypertension who noticed facial weakness this morning.  She states that she felt normal this morning, but then when she went to brush her teeth I was looking in the mirror she noticed that her face was asymmetric.  I asked her if she would have noticed this prior to looking the mirror, and she states that she is not sure if she would have or not.  She has not looked in the mirror for a couple of days over the weekend.  She does note that she had a pain behind her right ear a couple of days ago which has improved.  Yesterday she noticed that her right eye was tearing, but did not notice any facial weakness until today.   LKW: Unclear tpa given?: no, unclear time of onset    ROS: A 14 point ROS was performed and is negative except as noted in the HPI.  Past Medical History:  Diagnosis Date  . Coronary artery disease involving native heart with angina pectoris ([place]) 02/2013   Non-STEMI - 100% CTO RCA (small codominant posterior and.; Normal LAD. The distal circumflex 95% stenosis - PCI with DES 2 (Xience expedition stents overlapping -2.25 mm x 15 mm and 2.5 mm x 23 mm both postdilated 2.5 mm balloon)  . DM (diabetes mellitus), type 2 with complications (HCC)    CAD - MI.   . Essential hypertension   . Hyperlipidemia associated with type 2 diabetes mellitus ([place])   . Non-STEMI (non-ST elevated myocardial infarction) ([place]) 2014   PCI - (2 stents) -formerly cared for by Dr. Redmond Pulling [facility].     Family History  Problem Relation Age of Onset  . Diabetes Mother   . Heart disease Mother   . Liver disease Father   . Healthy Sister   . Hypertension Brother   . Diabetes Maternal Grandfather   . Diabetes Sister   . Hypertension Sister   . Heart disease Brother    . Heart attack Brother   . Diabetes Brother   . Hypertension Brother   . Hypertension Daughter   . Hypertension Son   . Diabetes Son   . Diabetes Son      Social History:  reports that she has been smoking cigarettes. She has never used smokeless tobacco. No history on file for alcohol and drug.   Exam: Current vital signs: There were no vitals taken for this visit. Vital signs in last 24 hours:     Physical Exam  Constitutional: Appears well-developed and well-nourished.  Psych: Affect appropriate to situation Eyes: No scleral injection HENT: No OP obstrucion Head: Normocephalic.  Cardiovascular: Normal rate and regular rhythm.  Respiratory: Effort normal, non-labored breathing GI: Soft.  No distension. There is no tenderness.  Skin: WDI  Neuro: Mental Status: Patient is awake, alert, oriented to person, place, month, year, and situation. Patient is able to give a clear and coherent history. No signs of aphasia or neglect Cranial Nerves: II: Visual Fields are full. Pupils are equal, round, and reactive to light.   III,IV, VI: EOMI without ptosis or diploplia.  V: Facial sensation is symmetric to temperature VII: Facial movement is weak on the right including some upper facial weakness VIII: hearing is intact to voice X: Uvula elevates symmetrically XI: Shoulder shrug is  symmetric. XII: tongue is midline without atrophy or fasciculations.  Motor: Tone is normal. Bulk is normal. 5/5 strength was present in all four extremities.  Sensory: Sensation is symmetric to light touch and temperature in the arms and legs. Cerebellar: FNF and HKS are intact bilaterally  I have reviewed labs in epic and the results pertinent to this consultation are: Chem-8-unremarkable  I have reviewed the images obtained: CT head- no acute findings  Impression: 57 year old female with ear pain followed by eye tearing and now with upper and lower facial weakness on the right.  This is  consistent with a Bell's palsy.  I do not think any further evaluation is needed at this time.  Recommendations: 1) treat as Bell's palsy, prednisone and Valtrex 2) call with further questions or concerns   Roland Rack, MD [facility] [phone]  If 7pm- 7am, please page neurology on call as listed in [place].

## 2019-03-21 NOTE — Code Documentation (Signed)
58yo female presenting to [facility] via private vehicle at [phone]. Patient from home where she was LKW at 2100 on 03/20/2019. She woke up this morning around 0415 and had coffee. She was brushing her teeth around 0600 and noticed she was unable to swish and noted right facial droop in the mirror. She also reports weakness and tearing of her right eye. She reports that she had not looked in the mirror this morning until this time. Of note, she also reports right jaw pain a few days ago. Code stroke called on patient arrival. Stroke team met patient in CT. CT completed. NIHSS 3, see documentation for details and code stroke times. Patient with upper and lower right facial weakness on exam. Dr. Leonel Ramsay at the bedside. Suspect Bell's palsy. Code stroke canceled. Bedside handoff with ED RN Selinda Eon.

## 2019-03-21 NOTE — ED Triage Notes (Signed)
Pt in with R facial wkns noticed at 0600 while she was brushing her teeth, unable to swish. Went to bed normally, woke at 0415, ate breakfast normal and symptoms only noticed at 0600. MAE's equally

## 2019-03-21 NOTE — ED Notes (Signed)
Pt presents to ED room after CT scan.  Neurology at bedside.  NIH and taste test being performed.  Diagnosed with Bells Palsy at this time.

## 2019-03-25 DIAGNOSIS — G510 Bell's palsy: Secondary | ICD-10-CM | POA: Diagnosis not present

## 2019-03-25 DIAGNOSIS — E1169 Type 2 diabetes mellitus with other specified complication: Secondary | ICD-10-CM | POA: Diagnosis not present

## 2019-03-30 MED FILL — LOSARTAN POTASSIUM 25 MG TA: 25 | 30 days supply | Qty: 30 | Fill #0

## 2019-04-05 DIAGNOSIS — G510 Bell's palsy: Secondary | ICD-10-CM | POA: Diagnosis not present

## 2019-04-17 MED ORDER — PROPOFOL 1000 MG/100ML IV EMUL
INTRAVENOUS | Status: AC
  Filled 2019-04-17: qty 100

## 2019-05-03 MED FILL — LOSARTAN POTASSIUM 25 MG TA: 25 | 30 days supply | Qty: 30 | Fill #1

## 2019-05-03 MED FILL — JARDIANCE 10 MG TABLET: 10 | 90 days supply | Qty: 90 | Fill #0

## 2019-06-02 MED FILL — LOSARTAN POTASSIUM 25 MG TA: 25 | 30 days supply | Qty: 30 | Fill #2

## 2019-06-02 MED FILL — metFORMIN HCL 500 MG TABS: 500 | 30 days supply | Qty: 120 | Fill #0

## 2019-06-02 MED FILL — BRILINTA 60 MG TABLET: 60 | 90 days supply | Qty: 180 | Fill #2

## 2019-06-02 MED FILL — OMEPRAZOLE 20 MG CAP: 20 | 90 days supply | Qty: 90 | Fill #0

## 2019-06-02 MED FILL — CARVEDILOL 25 MG TABLET: 25 | 90 days supply | Qty: 180 | Fill #0

## 2019-06-14 MED FILL — ATORVASTATIN 40 MG TABLET: 40 | 90 days supply | Qty: 45 | Fill #3

## 2019-06-14 MED FILL — BRILINTA 60 MG TABLET: 60 | 90 days supply | Qty: 180 | Fill #2

## 2019-07-01 ENCOUNTER — Encounter (HOSPITAL_COMMUNITY): Payer: Self-pay

## 2019-07-01 ENCOUNTER — Other Ambulatory Visit: Payer: Self-pay

## 2019-07-01 ENCOUNTER — Ambulatory Visit (HOSPITAL_COMMUNITY)
Admission: EM | Admit: 2019-07-01 | Discharge: 2019-07-01 | Disposition: A | Discharge status: Home / Self Care | Payer: 59 | Attending: Family Medicine | Admitting: Family Medicine

## 2019-07-01 DIAGNOSIS — R42 Dizziness and giddiness: Secondary | ICD-10-CM

## 2019-07-01 DIAGNOSIS — I951 Orthostatic hypotension: Secondary | ICD-10-CM

## 2019-07-01 LAB — POCT URINALYSIS DIP (DEVICE)
Bilirubin Urine: NEGATIVE
Glucose, UA: 500 mg/dL — AB
Hgb urine dipstick: NEGATIVE
Ketones, ur: NEGATIVE mg/dL
Leukocytes,Ua: NEGATIVE
Nitrite: NEGATIVE
Protein, ur: NEGATIVE mg/dL
Specific Gravity, Urine: 1.02 (ref 1.005–1.030)
Urobilinogen, UA: 0.2 mg/dL (ref 0.0–1.0)
pH: 5.5 (ref 5.0–8.0)

## 2019-07-01 LAB — GLUCOSE, CAPILLARY: Glucose-Capillary: 101 mg/dL — ABNORMAL HIGH (ref 70–99)

## 2019-07-01 NOTE — ED Provider Notes (Signed)
Media    CSN: [id number] Arrival date & time: 07/01/19  1115      History   Chief Complaint Chief Complaint  Patient presents with  . Dizziness    HPI Cindy Weber is a 58 y.o. female.   Cindy Weber presents with complaints of dizziness. She woke this morning feeling "foggy."  Thought once she woke up more it would improve, drank her coffee and no better. Thought maybe her blood sugar was low so drank sweet tea. Her glucometer is still in a box, as she moved last weekend into a new home. Feels unsteady on her feet. 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 Made her feel nauseated even due to the dizziness it produced. Raising from sitting increases it. No headache or ear pain. No URI symptoms. No vomiting. No chest pain or palpitations. States back in the 90's she had issues with inner ear infections and would get dizzy but hasn't had issues in years. Took all of her medications today including medications for her DM and htn. History  Of NSTemi in 2014 and breast cancer. No shortness of breath. No head injury or vision changes. Has not eaten any meal today, only drank the sweet tea.     ROS per HPI, negative if not otherwise mentioned.      Past Medical History:  Diagnosis Date  . Coronary artery disease involving native heart with angina pectoris ([place]) 02/2013   Non-STEMI - 100% CTO RCA (small codominant posterior and.; Normal LAD. The distal circumflex 95% stenosis - PCI with DES 2 (Xience expedition stents overlapping -2.25 mm x 15 mm and 2.5 mm x 23 mm both postdilated 2.5 mm balloon)  . DM (diabetes mellitus), type 2 with complications (HCC)    CAD - MI.   . Essential hypertension   . Hyperlipidemia associated with type 2 diabetes mellitus ([place])   . Non-STEMI (non-ST elevated myocardial infarction) ([place]) 2014   PCI - (2 stents) -formerly cared for by Dr. Redmond Pulling [facility].    Patient  Active Problem List   Diagnosis Date Noted  . DM (diabetes mellitus), type 2 with complications ([place])   . Essential hypertension   . Hyperlipidemia associated with type 2 diabetes mellitus ([address])   . Coronary artery disease involving native heart without angina pectoris 10/13/2012  . History of MI (myocardial infarction) 10/13/2012    Past Surgical History:  Procedure Laterality Date  . BREAST LUMPECTOMY Left 2014  . CARDIAC CATHETERIZATION  02/15/2013   [facility] (Dr. Daiva Huge): Codominant system.  100% CTO of small RCA.  d Cx 95%, normal LAD. -> PCI of dCx  . LEFT OOPHORECTOMY  1998  . PERCUTANEOUS CORONARY STENT INTERVENTION (PCI-S)  02/16/2013   [facility] -(Dr. Daiva Huge): PCI [place] 95%,- Xience expedition DES 2.25 mm x 15 mm overlap approximately with a Xience exhibition DES 2.5 mm x 23 mm -postdilated to 2.5 mm throughout  . TRANSTHORACIC ECHOCARDIOGRAM  02/15/2013   [facility]: Normal LV Size & function. EF ~55-60%.  GR1DD. Mild MR & LAE.     OB History   No obstetric history on file.      Home Medications    Prior to Admission medications   Medication Sig Start Date End Date Taking? Authorizing Provider  artificial tears (LACRILUBE) OINT ophthalmic ointment Place into the right eye at bedtime as needed for dry eyes. 03/21/19   Sherwood Gambler, MD  atorvastatin (LIPITOR) 80 MG  tablet Take 1 tablet by mouth daily. 10/10/15   [provider]  carvedilol (COREG) 25 MG tablet Take 1 tablet (25 mg total) by mouth 2 (two) times daily. 07/02/17   Leonie Man, MD  cyanocobalamin (V-R VITAMIN B-12) 500 MCG tablet Take 1 tablet by mouth daily.    [provider]  Ibuprofen-Famotidine (DUEXIS) 800-26.6 MG TABS Take 1 tablet by mouth 2 (two) times daily as needed. 08/05/17   Meredith Pel, MD  JARDIANCE 10 MG TABS tablet  10/29/18   [provider]  letrozole [facility]) 2.5 MG tablet Take 1 tablet by mouth daily. 06/22/17   [provider]  losartan (COZAAR) 25 MG  tablet  10/29/18   [provider]  metFORMIN (GLUCOPHAGE) 500 MG tablet Take 1 tablet by mouth 2 (two) times daily. 01/07/17   [provider]  omeprazole (PRILOSEC) 20 MG capsule Take 1 capsule by mouth daily. 05/14/17   [provider]  predniSONE (DELTASONE) 20 MG tablet 3 tabs po daily x 2 days, then 2 tabs x 3 days, then 1.5 tabs x 3 days, then 1 tab x 3 days, then 0.5 tabs x 3 days 03/22/19   Sherwood Gambler, MD  ticagrelor (BRILINTA) 60 MG TABS tablet Take 1 tablet (60 mg total) by mouth 2 (two) times daily. 11/05/18   Leonie Man, MD  valACYclovir (VALTREX) 1000 MG tablet Take 1 tablet (1,000 mg total) by mouth 3 (three) times daily. 03/21/19   Sherwood Gambler, MD    Family History Family History  Problem Relation Age of Onset  . Diabetes Mother   . Heart disease Mother   . Liver disease Father   . Healthy Sister   . Hypertension Brother   . Diabetes Maternal Grandfather   . Diabetes Sister   . Hypertension Sister   . Heart disease Brother   . Heart attack Brother   . Diabetes Brother   . Hypertension Brother   . Hypertension Daughter   . Hypertension Son   . Diabetes Son   . Diabetes Son     Social History Social History   Tobacco Use  . Smoking status: Current Every Day Smoker    Types: Cigarettes  . Smokeless tobacco: Never Used  Substance Use Topics  . Alcohol use: Not on file  . Drug use: Not on file     Allergies   Patient has no known allergies.   Review of Systems Review of Systems   Physical Exam Triage Vital Signs ED Triage Vitals  Enc Vitals Group     BP 07/01/19 1137 113/71     Pulse Rate 07/01/19 1137 71     Resp 07/01/19 1137 16     Temp 07/01/19 1137 98.3 F (36.8 C)     Temp Source 07/01/19 1137 Oral     SpO2 07/01/19 1137 96 %     Weight --      Height --      Head Circumference --      Peak Flow --      Pain Score 07/01/19 1133 0     Pain Loc --      Pain Edu? --      Excl. in [place]? --    Orthostatic  VS for the past 24 hrs:  BP- Lying Pulse- Lying BP- Sitting Pulse- Sitting BP- Standing at 0 minutes Pulse- Standing at 0 minutes  07/01/19 1237 114/69 67 123/73 72 104/73 74    Updated Vital  Signs BP 113/71 (BP Location: Left Arm)   Pulse 71   Temp 98.3 F (36.8 C) (Oral)   Resp 16   SpO2 96%    Physical Exam Constitutional:      General: She is not in acute distress.    Appearance: She is well-developed.  HENT:     Right Ear: Tympanic membrane and ear canal normal.     Left Ear: Tympanic membrane and ear canal normal.     Mouth/Throat:     Mouth: Mucous membranes are dry.  Eyes:     Extraocular Movements: Extraocular movements intact.     Pupils: Pupils are equal, round, and reactive to light.  Neck:     Vascular: No carotid bruit.  Cardiovascular:     Rate and Rhythm: Normal rate and regular rhythm.  Pulmonary:     Effort: Pulmonary effort is normal.  Skin:    General: Skin is warm and dry.  Neurological:     Mental Status: She is alert and oriented to person, place, and time.     Cranial Nerves: Cranial nerves are intact. No facial asymmetry.     Sensory: Sensation is intact.     Motor: Motor function is intact.     Coordination: Romberg sign positive.     Comments: Dizziness with romberg and simply with raising from table to standing     EKG:  NSR rate of 65  . Previous EKG was available for review. No stwave changes as interpreted by me.    UC Treatments / Results  Labs (all labs ordered are listed, but only abnormal results are displayed) Labs Reviewed  GLUCOSE, CAPILLARY - Abnormal; Notable for the following components:      Result Value   Glucose-Capillary 101 (*)    All other components within normal limits  POCT URINALYSIS DIP (DEVICE) - Abnormal; Notable for the following components:   Glucose, UA 500 (*)    All other components within normal limits  CBC WITH DIFFERENTIAL/PLATELET  COMPREHENSIVE METABOLIC PANEL  TSH  CBG MONITORING, ED    EKG    Radiology No results found.  Procedures Procedures (including critical care time)  Medications Ordered in UC Medications - No data to display  Initial Impression / Assessment and Plan / UC Course  I have reviewed the triage vital signs and the nursing notes.  Pertinent labs & imaging results that were available during my care of the patient were reviewed by me and considered in my medical decision making (see chart for details).     Orthostatic hypotension. Hasn't eaten today. Mouth is extremely dry. Minimal fluid intake today. Unfortunately unable to collect blood today to check cbc or cmp. Blood sugar 101, although noted glucose in urine of 500. Emphasized to increase intake , follow up next week closely for recheck of blood pressure and symptoms. Return precautions provided. Patient verbalized understanding and agreeable to plan.   Final Clinical Impressions(s) / UC Diagnoses   Final diagnoses:  Dizziness  Orthostatic hypotension     Discharge Instructions     Your blood pressure drops with position changes, this may be related to your blood pressure medications, but sounds likely from dehydration as well.  Please rest, increase your intake of fluids and regular meals.  Avoid too high of sugar meals.  Change positions slowly, remaining sitting for at least 30 seconds before standing.  Please follow up with your primary care provider next week for recheck of your bp and symptoms.  Any  worsening of symptoms- worsening dizziness, headache, vision changes, chest pain , weakness or otherwise worsening please go to the ER.     ED Prescriptions    None     PDMP not reviewed this encounter.   Zigmund Gottron, NP 07/01/19 1308

## 2019-07-01 NOTE — Discharge Instructions (Addendum)
Your blood pressure drops with position changes, this may be related to your blood pressure medications, but sounds likely from dehydration as well.  Please rest, increase your intake of fluids and regular meals.  Avoid too high of sugar meals.  Change positions slowly, remaining sitting for at least 30 seconds before standing.  Please follow up with your primary care provider next week for recheck of your bp and symptoms.  Any worsening of symptoms- worsening dizziness, headache, vision changes, chest pain , weakness or otherwise worsening please go to the ER.

## 2019-07-01 NOTE — ED Triage Notes (Signed)
Patient report she woke up this morning and started feeling dizzy and having difficult maintenance her balance.

## 2019-07-12 MED FILL — FLUCONAZOLE 150 MG TABS: 150 | 1 days supply | Qty: 1 | Fill #0

## 2019-08-11 MED FILL — JARDIANCE 10 MG TABLET: 10 | 90 days supply | Qty: 90 | Fill #1

## 2019-08-11 MED FILL — FLUCONAZOLE 150 MG TABS: 150 | 1 days supply | Qty: 1 | Fill #1

## 2019-08-26 MED FILL — LOSARTAN POTASSIUM 25 MG TA: 25 | 60 days supply | Qty: 60 | Fill #4

## 2019-09-20 MED FILL — OMEPRAZOLE 20 MG CAP: 20 | 90 days supply | Qty: 90 | Fill #1

## 2019-09-20 MED FILL — CARVEDILOL 25 MG TABLET: 25 | 90 days supply | Qty: 180 | Fill #1

## 2019-09-20 MED FILL — ATORVASTATIN 40 MG TABLET: 40 | 90 days supply | Qty: 45 | Fill #0

## 2019-09-20 MED FILL — FLUCONAZOLE 150 MG TABS: 150 | 1 days supply | Qty: 1 | Fill #2

## 2019-09-20 MED FILL — BRILINTA 60 MG TABLET: 60 | 90 days supply | Qty: 180 | Fill #3

## 2019-11-10 MED FILL — LOSARTAN POTASSIUM 25 MG TA: 25 | 90 days supply | Qty: 90 | Fill #0

## 2019-11-11 MED FILL — metFORMIN HCL 500 MG TABS: 500 | 90 days supply | Qty: 360 | Fill #1

## 2019-11-14 MED FILL — JARDIANCE 10 MG TABLET: 10 | 90 days supply | Qty: 90 | Fill #0

## 2019-12-14 ENCOUNTER — Other Ambulatory Visit: Payer: Self-pay | Admitting: Cardiology

## 2019-12-14 MED FILL — BRILINTA 60 MG TABLET: 60 | 30 days supply | Qty: 60 | Fill #0

## 2019-12-14 MED FILL — ATORVASTATIN 40 MG TABLET: 40 | 90 days supply | Qty: 45 | Fill #0

## 2019-12-14 MED FILL — CARVEDILOL 25 MG TABLET: 25 | 90 days supply | Qty: 180 | Fill #0

## 2019-12-14 MED FILL — OMEPRAZOLE 20 MG CAP: 20 | 90 days supply | Qty: 90 | Fill #0

## 2019-12-16 ENCOUNTER — Telehealth: Payer: Self-pay

## 2019-12-16 NOTE — Telephone Encounter (Signed)
   [facility] Pre-operative Risk Assessment    Request for surgical clearance:  1. What type of surgery is being performed? Pt needs two teeth removed, some bone removal surgery, and implant placement.   2. When is this surgery scheduled? TBD   3. What type of clearance is required (medical clearance vs. Pharmacy clearance to hold med vs. Both)? pharmacy  4. Are there any medications that need to be held prior to surgery and how long? Brilinta 75m BID  5. Practice name and name of physician performing surgery? [facility]  6. What is your office phone number [phone]   7.   What is your office fax number [phone].   Anesthesia type (None, local, MAC, general) ? unknown   KSherrie Mustache3/02/2020, 3:49 PM  _________________________________________________________________   (provider comments below)

## 2019-12-16 NOTE — Telephone Encounter (Signed)
   Primary Cardiologist: Glenetta Hew, MD  Chart reviewed as part of pre-operative protocol coverage.   Per Dr. Ellyn Hack at visit 10/2018, and given lack of interval change in cardiac condition since that time, patient can hold brilinta 5 days prior to her upcoming oral surgery. Brilinta should be restarted as soon as she is cleared to do so by her dentist.   I will route this recommendation to the requesting party via [place] fax function and remove from pre-op pool.  Please call with questions.  Abigail Butts, PA-C 12/16/2019, 4:38 PM

## 2019-12-20 MED FILL — FLUCONAZOLE 150 MG TABS: 150 | 1 days supply | Qty: 1 | Fill #0

## 2020-01-12 ENCOUNTER — Other Ambulatory Visit (HOSPITAL_COMMUNITY)
Admission: RE | Admit: 2020-01-12 | Discharge: 2020-01-12 | Disposition: A | Discharge status: Home / Self Care | Payer: 59 | Source: Ambulatory Visit | Attending: Family Medicine | Admitting: Family Medicine

## 2020-01-12 ENCOUNTER — Other Ambulatory Visit: Payer: Self-pay | Admitting: Family Medicine

## 2020-01-12 DIAGNOSIS — Z853 Personal history of malignant neoplasm of breast: Secondary | ICD-10-CM | POA: Diagnosis not present

## 2020-01-12 DIAGNOSIS — F17200 Nicotine dependence, unspecified, uncomplicated: Secondary | ICD-10-CM | POA: Diagnosis not present

## 2020-01-12 DIAGNOSIS — E7800 Pure hypercholesterolemia, unspecified: Secondary | ICD-10-CM | POA: Diagnosis not present

## 2020-01-12 DIAGNOSIS — E1169 Type 2 diabetes mellitus with other specified complication: Secondary | ICD-10-CM | POA: Diagnosis not present

## 2020-01-12 DIAGNOSIS — Z1159 Encounter for screening for other viral diseases: Secondary | ICD-10-CM | POA: Diagnosis not present

## 2020-01-12 DIAGNOSIS — Z124 Encounter for screening for malignant neoplasm of cervix: Secondary | ICD-10-CM | POA: Diagnosis not present

## 2020-01-12 DIAGNOSIS — I10 Essential (primary) hypertension: Secondary | ICD-10-CM | POA: Diagnosis not present

## 2020-01-12 DIAGNOSIS — E669 Obesity, unspecified: Secondary | ICD-10-CM | POA: Diagnosis not present

## 2020-01-12 DIAGNOSIS — K219 Gastro-esophageal reflux disease without esophagitis: Secondary | ICD-10-CM | POA: Diagnosis not present

## 2020-01-12 DIAGNOSIS — I2510 Atherosclerotic heart disease of native coronary artery without angina pectoris: Secondary | ICD-10-CM | POA: Diagnosis not present

## 2020-01-12 DIAGNOSIS — Z0000 Encounter for general adult medical examination without abnormal findings: Secondary | ICD-10-CM | POA: Diagnosis not present

## 2020-01-12 MED FILL — VALSARTAN 80 MG TABLET: 80 | 90 days supply | Qty: 90 | Fill #0

## 2020-01-17 LAB — CYTOLOGY - PAP
Adequacy: ABSENT
Comment: NEGATIVE
Diagnosis: NEGATIVE
High risk HPV: NEGATIVE

## 2020-01-25 DIAGNOSIS — L089 Local infection of the skin and subcutaneous tissue, unspecified: Secondary | ICD-10-CM | POA: Diagnosis not present

## 2020-01-25 DIAGNOSIS — T7840XA Allergy, unspecified, initial encounter: Secondary | ICD-10-CM | POA: Diagnosis not present

## 2020-01-25 MED FILL — MUPIROCIN 2% OINTMENT: 2 | 5 days supply | Qty: 22 | Fill #0

## 2020-01-25 MED FILL — predniSONE 20 MG TABS: 20 | 4 days supply | Qty: 8 | Fill #0

## 2020-01-28 MED FILL — predniSONE 20 MG TABS: 20 | 5 days supply | Qty: 5 | Fill #0

## 2020-02-01 ENCOUNTER — Ambulatory Visit: Payer: 59 | Admitting: Cardiology

## 2020-02-01 ENCOUNTER — Other Ambulatory Visit: Payer: Self-pay

## 2020-02-01 ENCOUNTER — Encounter: Payer: Self-pay | Admitting: Cardiology

## 2020-02-01 VITALS — BP 113/75 | HR 87 | Temp 98.2°F | Resp 12 | Ht 66.0 in | Wt 192.0 lb

## 2020-02-01 DIAGNOSIS — E785 Hyperlipidemia, unspecified: Secondary | ICD-10-CM | POA: Diagnosis not present

## 2020-02-01 DIAGNOSIS — E118 Type 2 diabetes mellitus with unspecified complications: Secondary | ICD-10-CM | POA: Diagnosis not present

## 2020-02-01 DIAGNOSIS — I2510 Atherosclerotic heart disease of native coronary artery without angina pectoris: Secondary | ICD-10-CM | POA: Diagnosis not present

## 2020-02-01 DIAGNOSIS — I10 Essential (primary) hypertension: Secondary | ICD-10-CM | POA: Diagnosis not present

## 2020-02-01 DIAGNOSIS — E1169 Type 2 diabetes mellitus with other specified complication: Secondary | ICD-10-CM

## 2020-02-01 DIAGNOSIS — I252 Old myocardial infarction: Secondary | ICD-10-CM

## 2020-02-01 NOTE — Progress Notes (Signed)
Primary Care Provider: Lois Huxley, PA Cardiologist: Glenetta Hew, MD Electrophysiologist: None  Previous Cardiologist in [facility]): Dr. Ria [facility]  Clinic Note: Chief Complaint  Patient presents with  . Follow-up    She postponed her annual follow-up because her husband was sick with Covid  . Coronary Artery Disease    No angina   HPI:    Cindy Weber is a 59 y.o. female with a CAD history noted below who presents today for delayed annual follow-up.  History of MI with 2 stents in 2014.  Had noted intermittent episodes of and the day chest pain for several days prior to that episode.  Cindy Weber was last seen in January 2020.  She indicated that she was doing pretty well.  Try to get in walking at work.  Had intermittent right upper chest and shoulder pain that is sharp and nagging and not like her cardiac symptoms.  Was down to about a pack a day smoking but not ready to quit.  Recent Hospitalizations:   07/01/2019-Urgent Care Evaluation dizziness, orthostatic hypotension  Reviewed  CV studies:    The following studies were reviewed today: (if available, images/films reviewed: From Epic Chart or Care Everywhere) . none   Interval History:   Cindy Weber returns here today overall doing quite well.  She has not had any cardiac symptoms to speak of.  She avoided Covid infection when her husband was sick along with other family members.  She was his major caregiver and never got sick.  The family is recovered and she stayed healthy.  The only issue of late was that she was getting Henna tattoos for her eyebrows and had a significant reaction to the dye that was used.  She had a significant bout of skin sloughing and inflammation with swelling etc.  She had to take steroids and Benadryl etc.  Finally things have resolved but her skin where her eyebrows would be is very very tender.  She is wearing make-up down to cover it.  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  Her unique history with herself being a cancer survivor as well as a heart attack survivor makes her a great resource for these patients.  This also provides motivation for them in her.  She is very happy with her work and feels very fulfilled.  She used to have some intermittent right-sided chest pain but even that is gone away.  She is pretty active, but does not routinely exercise.  CV Review of Symptoms (Summary) Cardiovascular ROS: no chest pain or dyspnea on exertion negative for - edema, irregular heartbeat, loss of consciousness, orthopnea, palpitations, paroxysmal nocturnal dyspnea, rapid heart rate, shortness of breath or Near syncope, TIA/amaurosis fugax, claudication  The patient does not have symptoms concerning for COVID-19 infection (fever, chills, cough, or new shortness of breath).  The patient is practicing social distancing & Masking.   She has had both COVID-19 vaccine injections  REVIEWED OF SYSTEMS   ROS -Negative  I have reviewed and (if needed) personally updated the patient's problem list, medications, allergies, past medical and surgical history, social and family history.   PAST MEDICAL HISTORY   Past Medical History:  Diagnosis Date  . Breast cancer in female [facility]) 2013   - Lumpectomy & LN resection.  Chemo -- > residual hair loss (eye brows & hair);   Marland Kitchen Coronary artery disease involving native heart with angina pectoris ([place])  02/2013   Non-STEMI - 100% CTO RCA (small codominant posterior and.; Normal LAD. The distal circumflex 95% stenosis - PCI with DES 2 (Xience expedition stents overlapping -2.25 mm x 15 mm and 2.5 mm x 23 mm both postdilated 2.5 mm balloon)  . DM (diabetes mellitus), type 2 with complications (HCC)    CAD - MI.   . Essential hypertension   . Hyperlipidemia associated with type 2 diabetes mellitus ([place])   . Non-STEMI (non-ST elevated myocardial infarction) ([place])  2014   PCI - (2 stents) -formerly cared for by Dr. Redmond Pulling [facility].    PAST SURGICAL HISTORY   Past Surgical History:  Procedure Laterality Date  . BREAST LUMPECTOMY Left 2014  . CARDIAC CATHETERIZATION  02/15/2013   [facility] (Dr. Daiva Huge): Codominant system.  100% CTO of small RCA.  d Cx 95%, normal LAD. -> PCI of dCx  . LEFT OOPHORECTOMY  1998  . PERCUTANEOUS CORONARY STENT INTERVENTION (PCI-S)  02/16/2013   [facility] -(Dr. Daiva Huge): PCI [place] 95%,- Xience expedition DES 2.25 mm x 15 mm overlap approximately with a Xience exhibition DES 2.5 mm x 23 mm -postdilated to 2.5 mm throughout  . TRANSTHORACIC ECHOCARDIOGRAM  02/15/2013   [facility]: Normal LV Size & function. EF ~55-60%.  GR1DD. Mild MR & LAE.     MEDICATIONS/ALLERGIES   Current Meds  Medication Sig  . atorvastatin (LIPITOR) 80 MG tablet Take 1 tablet by mouth daily. Take 1/2 tablet  . carvedilol (COREG) 25 MG tablet Take 1 tablet (25 mg total) by mouth 2 (two) times daily.  . cyanocobalamin (V-R VITAMIN B-12) 500 MCG tablet Take 1 tablet by mouth daily.  . Ibuprofen-Famotidine (DUEXIS) 800-26.6 MG TABS Take 1 tablet by mouth 2 (two) times daily as needed.  Marland Kitchen JARDIANCE 10 MG TABS tablet   . letrozole (FEMARA) 2.5 MG tablet Take 1 tablet by mouth daily.  . metFORMIN (GLUCOPHAGE) 500 MG tablet Take 1 tablet by mouth 2 (two) times daily.  Marland Kitchen omeprazole (PRILOSEC) 20 MG capsule Take 1 capsule by mouth daily.  . ticagrelor (BRILINTA) 60 MG TABS tablet Take 1 tablet (60 mg total) by mouth 2 (two) times daily. Please schedule annual appt with Dr. Ellyn Hack for refills. [phone]. 1st attempt.  . valACYclovir (VALTREX) 1000 MG tablet Take 1 tablet (1,000 mg total) by mouth 3 (three) times daily.  . valsartan (DIOVAN) 80 MG tablet Take 80 mg by mouth daily.  . [DISCONTINUED] artificial tears (LACRILUBE) OINT ophthalmic ointment Place into the right eye at bedtime as needed for dry eyes.  . [DISCONTINUED] losartan (COZAAR) 25 MG tablet     . [DISCONTINUED] mupirocin ointment (BACTROBAN) 2 % SMARTSIG:1 Application Topical 2-3 Times Daily  . [DISCONTINUED] predniSONE (DELTASONE) 20 MG tablet 3 tabs po daily x 2 days, then 2 tabs x 3 days, then 1.5 tabs x 3 days, then 1 tab x 3 days, then 0.5 tabs x 3 days    No Known Allergies  SOCIAL HISTORY/FAMILY HISTORY   Reviewed in Epic:  Pertinent findings: No new changes; --> She is the major caregiver for her husband at home who is complete care.  He recently, from COVID-19.  OBJCTIVE -PE, EKG, labs    Wt Readings from Last 3 Encounters:  02/01/20 192 lb (87.1 kg)  11/05/18 191 lb (86.6 kg)  07/02/17 197 lb 12.8 oz (89.7 kg)    Physical Exam: BP 113/75   Pulse 87   Temp 98.2 F (36.8 C)   Resp 12  Ht 5\' 6"  (1.676 m)   Wt 192 lb (87.1 kg)   SpO2 95%   BMI 30.99 kg/m  Physical Exam  Constitutional: She is oriented to person, place, and time. She appears well-developed and well-nourished.  Mildly obese  Neck: No hepatojugular reflux and no JVD present. Carotid bruit is not present.  Cardiovascular: Normal rate, regular rhythm, normal heart sounds and normal pulses.  No extrasystoles are present. PMI is not displaced. Exam reveals no gallop and no friction rub.  No murmur heard. Pulmonary/Chest: Effort normal and breath sounds normal. No respiratory distress. She has no wheezes. She has no rales.  Musculoskeletal:        General: No edema. Normal range of motion.     Cervical back: Normal range of motion and neck supple.  Neurological: She is alert and oriented to person, place, and time.  Psychiatric: She has a normal mood and affect. Her behavior is normal. Judgment and thought content normal.  Vitals reviewed.   Adult ECG Report  Rate: 87;  Rhythm: normal sinus rhythm and LVH with repolarization changes otherwise normal axis, intervals and durations.;   Narrative Interpretation: Stable EKG.  Recent Labs: January 12, 2020: TC 80, TG 255 (while on steroids), HDL 20  ;LDL (not reported)-was 34 in 2020 No results found for: CHOL, HDL, LDLCALC, LDLDIRECT, TRIG, CHOLHDL Lab Results  Component Value Date   CREATININE 0.70 03/21/2019   BUN 14 03/21/2019   NA 138 03/21/2019   K 4.1 03/21/2019   CL 109 03/21/2019   CO2 19 (L) 03/21/2019  No results found for: HGBA1C  No results found for: TSH  ASSESSMENT/PLAN    Problem List Items Addressed This Visit    Coronary artery disease involving native heart without angina pectoris - Primary (Chronic)    Has been stable since 2014 with no recurrent angina symptoms.  Active but no routine exercise.  Remains on maintenance dose Brilinta 60 mg twice daily  Okay to hold Brilinta 5-7 days preop for procedures or surgeries.  We talked about secondary prevention at this timeframe, and she is comfortable taking Brilinta over aspirin.  On max dose atorvastatin along with carvedilol and Diovan (converted from ACE inhibitor)      Relevant Medications   valsartan (DIOVAN) 80 MG tablet   Other Relevant Orders   EKG 12-Lead   History of MI (myocardial infarction) (Chronic)    Distant history of MI with PCI to LCx in 2014.  Was noted to have occluded nondominant RCA.  Follow-up echo showed preserved EF with no significant wall motion normality.  No angina or heart failure symptoms since.      Relevant Orders   EKG 12-Lead   DM (diabetes mellitus), type 2 with complications (HCC) (Chronic)    A1c was 6.8 on her most recent check which is up from last year.  I also suspect that this may be related to her being on steroids at the time these labs were checked.  She is on Metformin along with Jardiance.  Appropriately for cardiac patient. On ARB.      Relevant Medications   valsartan (DIOVAN) 80 MG tablet   Hyperlipidemia associated with type 2 diabetes mellitus ([place]) (Chronic)    Lipids remain to be relatively well controlled.  Triglycerides seem to be the major issue and may be related to her blood sugars. If  they remain elevated, would probably consider Vascepa (1 g tabs, 2 tabs twice daily).      Relevant Medications  valsartan (DIOVAN) 80 MG tablet   Essential hypertension (Chronic)    Blood pressure well controlled on carvedilol and now valsartan.  She had switched from lisinopril to losartan because of cough and shortly after that switch still had cough with losartan.  Doing well with Diovan.      Relevant Medications   valsartan (DIOVAN) 80 MG tablet     We did not actually discuss smoking cessation today.  She had not seem to be interested in this discussion last visit.   COVID-19 Education: The signs and symptoms of COVID-19 were discussed with the patient and how to seek care for testing (follow up with PCP or arrange E-visit).   The importance of social distancing and COVID-19 vaccination was discussed today.  I spent a total of 22 minutes with the patient. >  50% of the time was spent in direct patient consultation.  Additional time spent with chart review  / charting (studies, outside notes, etc): 8 Total Time: 30 min   Current medicines are reviewed at length with the patient today.  (+/- concerns) none  Notice: This dictation was prepared with Dragon dictation along with smaller phrase technology. Any transcriptional errors that result from this process are unintentional and may not be corrected upon review.  Patient Instructions / Medication Changes & Studies & Tests Ordered   Patient Instructions  Medication Instructions:  The current medical regimen is effective;  continue present plan and medications as directed. Please refer to the Current Medication list given to you today. *If you need a refill on your cardiac medications before your next appointment, please call your pharmacy*  Follow-Up: Your next appointment:  12 month(s) Please call our office 2 months in advance to schedule this appointment In Person with Glenetta Hew, MD  At [facility], you and your  health needs are our priority.  As part of our continuing mission to provide you with exceptional heart care, we have created designated Provider Care Teams.  These Care Teams include your primary Cardiologist (physician) and Advanced Practice Providers (APPs -  Physician Assistants and Nurse Practitioners) who all work together to provide you with the care you need, when you need it.      Studies Ordered:   Orders Placed This Encounter  Procedures  . EKG 12-Lead     Glenetta Hew, M.D., M.S. Interventional Cardiologist   Pager # [phone] Phone # [phone] [address] [place], [place] [address]   Thank you for choosing Heartcare at [facility]!!

## 2020-02-01 NOTE — Patient Instructions (Signed)
Medication Instructions:  The current medical regimen is effective;  continue present plan and medications as directed. Please refer to the Current Medication list given to you today. *If you need a refill on your cardiac medications before your next appointment, please call your pharmacy*  Follow-Up: Your next appointment:  12 month(s) Please call our office 2 months in advance to schedule this appointment In Person with Glenetta Hew, MD  At [facility], you and your health needs are our priority.  As part of our continuing mission to provide you with exceptional heart care, we have created designated Provider Care Teams.  These Care Teams include your primary Cardiologist (physician) and Advanced Practice Providers (APPs -  Physician Assistants and Nurse Practitioners) who all work together to provide you with the care you need, when you need it.

## 2020-02-05 ENCOUNTER — Encounter: Payer: Self-pay | Admitting: Cardiology

## 2020-02-05 NOTE — Assessment & Plan Note (Addendum)
Distant history of MI with PCI to LCx in 2014.  Was noted to have occluded nondominant RCA.  Follow-up echo showed preserved EF with no significant wall motion normality.  No angina or heart failure symptoms since.

## 2020-02-05 NOTE — Assessment & Plan Note (Signed)
Has been stable since 2014 with no recurrent angina symptoms.  Active but no routine exercise.  Remains on maintenance dose Brilinta 60 mg twice daily  Okay to hold Brilinta 5-7 days preop for procedures or surgeries.  We talked about secondary prevention at this timeframe, and she is comfortable taking Brilinta over aspirin.  On max dose atorvastatin along with carvedilol and Diovan (converted from ACE inhibitor)

## 2020-02-05 NOTE — Assessment & Plan Note (Signed)
Blood pressure well controlled on carvedilol and now valsartan.  She had switched from lisinopril to losartan because of cough and shortly after that switch still had cough with losartan.  Doing well with Diovan.

## 2020-02-05 NOTE — Assessment & Plan Note (Signed)
A1c was 6.8 on her most recent check which is up from last year.  I also suspect that this may be related to her being on steroids at the time these labs were checked.  She is on Metformin along with Jardiance.  Appropriately for cardiac patient. On ARB.

## 2020-02-05 NOTE — Assessment & Plan Note (Signed)
Lipids remain to be relatively well controlled.  Triglycerides seem to be the major issue and may be related to her blood sugars. If they remain elevated, would probably consider Vascepa (1 g tabs, 2 tabs twice daily).

## 2020-02-06 MED FILL — FLUCONAZOLE 150 MG TABS: 150 | 1 days supply | Qty: 1 | Fill #1

## 2020-02-09 DIAGNOSIS — R42 Dizziness and giddiness: Secondary | ICD-10-CM | POA: Diagnosis not present

## 2020-02-09 MED FILL — MECLIZINE 25 MG TABLET: 25 | 14 days supply | Qty: 28 | Fill #0

## 2020-02-10 ENCOUNTER — Other Ambulatory Visit: Payer: Self-pay | Admitting: Cardiology

## 2020-02-13 MED FILL — BRILINTA 60 MG TABLET: 60 | 30 days supply | Qty: 60 | Fill #0

## 2020-02-17 MED FILL — METFORMIN HCL 500 MG TABS: 500 | 90 days supply | Qty: 360 | Fill #0

## 2020-02-17 MED FILL — JARDIANCE 10 MG TABLET: 10 | 90 days supply | Qty: 90 | Fill #0

## 2020-03-16 ENCOUNTER — Other Ambulatory Visit: Payer: Self-pay | Admitting: Cardiology

## 2020-03-16 MED FILL — BRILINTA 60 MG TABLET: 60 | 30 days supply | Qty: 60 | Fill #0

## 2020-04-03 DIAGNOSIS — Z1231 Encounter for screening mammogram for malignant neoplasm of breast: Secondary | ICD-10-CM | POA: Diagnosis not present

## 2020-04-19 MED FILL — VALSARTAN 80 MG TABLET: 80 | 90 days supply | Qty: 90 | Fill #1

## 2020-05-10 MED FILL — BRILINTA 60 MG TABLET: 60 | 30 days supply | Qty: 60 | Fill #1

## 2020-05-21 MED FILL — JARDIANCE 10 MG TABLET: 10 | 90 days supply | Qty: 90 | Fill #1

## 2020-06-06 MED FILL — JARDIANCE 10 MG TABLET: 10 | 90 days supply | Qty: 90 | Fill #1

## 2020-06-25 MED FILL — BRILINTA 60 MG TABLET: 60 | 30 days supply | Qty: 60 | Fill #2

## 2020-07-21 MED FILL — ATORVASTATIN 40 MG TABLET: 40 | 90 days supply | Qty: 45 | Fill #1

## 2020-07-21 MED FILL — CARVEDILOL 25 MG TABLET: 25 | 90 days supply | Qty: 180 | Fill #1

## 2020-07-24 MED FILL — METFORMIN HCL 500 MG TABS: 500 | 90 days supply | Qty: 360 | Fill #1

## 2020-07-25 MED FILL — BRILINTA 60 MG TABLET: 60 | 30 days supply | Qty: 60 | Fill #3

## 2020-07-31 MED FILL — VALSARTAN 80 MG TABS: 80 | 90 days supply | Qty: 90 | Fill #0

## 2020-08-22 ENCOUNTER — Other Ambulatory Visit (HOSPITAL_COMMUNITY): Payer: Self-pay | Admitting: Family Medicine

## 2020-08-22 DIAGNOSIS — F17200 Nicotine dependence, unspecified, uncomplicated: Secondary | ICD-10-CM | POA: Diagnosis not present

## 2020-08-22 DIAGNOSIS — I2510 Atherosclerotic heart disease of native coronary artery without angina pectoris: Secondary | ICD-10-CM | POA: Diagnosis not present

## 2020-08-22 DIAGNOSIS — E1169 Type 2 diabetes mellitus with other specified complication: Secondary | ICD-10-CM | POA: Diagnosis not present

## 2020-08-22 DIAGNOSIS — E669 Obesity, unspecified: Secondary | ICD-10-CM | POA: Diagnosis not present

## 2020-08-22 DIAGNOSIS — I10 Essential (primary) hypertension: Secondary | ICD-10-CM | POA: Diagnosis not present

## 2020-08-22 DIAGNOSIS — I25119 Atherosclerotic heart disease of native coronary artery with unspecified angina pectoris: Secondary | ICD-10-CM | POA: Diagnosis not present

## 2020-08-22 DIAGNOSIS — K219 Gastro-esophageal reflux disease without esophagitis: Secondary | ICD-10-CM | POA: Diagnosis not present

## 2020-08-22 DIAGNOSIS — Z853 Personal history of malignant neoplasm of breast: Secondary | ICD-10-CM | POA: Diagnosis not present

## 2020-08-22 DIAGNOSIS — E7800 Pure hypercholesterolemia, unspecified: Secondary | ICD-10-CM | POA: Diagnosis not present

## 2020-08-22 MED FILL — VALSARTAN 80 MG TABLET: 80 | 90 days supply | Qty: 90 | Fill #0

## 2020-08-22 MED FILL — FLUCONAZOLE 150 MG TABS: 150 | 2 days supply | Qty: 2 | Fill #0

## 2020-08-22 MED FILL — OMEPRAZOLE 20 MG CAP: 20 | 90 days supply | Qty: 90 | Fill #0

## 2020-08-22 MED FILL — JARDIANCE 10 MG TABLET: 10 | 90 days supply | Qty: 90 | Fill #0

## 2020-09-10 MED FILL — FLUCONAZOLE 150 MG TABS: 150 | 2 days supply | Qty: 2 | Fill #1

## 2020-09-10 MED FILL — BRILINTA 60 MG TABLET: 60 | 30 days supply | Qty: 60 | Fill #4

## 2020-10-15 MED FILL — BRILINTA 60 MG TABLET: 60 | 30 days supply | Qty: 60 | Fill #5

## 2020-10-15 MED FILL — CARVEDILOL 25 MG TABS: 25 | 90 days supply | Qty: 180 | Fill #0

## 2020-10-19 ENCOUNTER — Other Ambulatory Visit (HOSPITAL_COMMUNITY): Payer: Self-pay | Admitting: Family Medicine

## 2020-10-19 MED FILL — FLUCONAZOLE 150 MG TABS: 150 | 2 days supply | Qty: 2 | Fill #2

## 2020-10-19 MED FILL — ATORVASTATIN 40 MG TABLET: 40 | 90 days supply | Qty: 45 | Fill #0

## 2020-11-20 MED FILL — JARDIANCE 10 MG TABLET: 10 | 90 days supply | Qty: 90 | Fill #1

## 2020-11-20 MED FILL — VALSARTAN 80 MG TABLET: 80 | 90 days supply | Qty: 90 | Fill #1

## 2020-11-20 MED FILL — BRILINTA 60 MG TABLET: 60 | 30 days supply | Qty: 60 | Fill #6

## 2020-12-04 MED FILL — FLUCONAZOLE 150 MG TABS: 150 | 2 days supply | Qty: 2 | Fill #3

## 2021-01-07 DIAGNOSIS — E119 Type 2 diabetes mellitus without complications: Secondary | ICD-10-CM | POA: Diagnosis not present

## 2021-01-07 DIAGNOSIS — H31091 Other chorioretinal scars, right eye: Secondary | ICD-10-CM | POA: Diagnosis not present

## 2021-01-07 DIAGNOSIS — H25813 Combined forms of age-related cataract, bilateral: Secondary | ICD-10-CM | POA: Diagnosis not present

## 2021-01-07 DIAGNOSIS — H35033 Hypertensive retinopathy, bilateral: Secondary | ICD-10-CM | POA: Diagnosis not present

## 2021-01-15 ENCOUNTER — Other Ambulatory Visit (HOSPITAL_COMMUNITY): Payer: Self-pay

## 2021-01-15 ENCOUNTER — Other Ambulatory Visit: Payer: Self-pay

## 2021-01-15 ENCOUNTER — Other Ambulatory Visit: Payer: Self-pay | Admitting: Family Medicine

## 2021-01-15 ENCOUNTER — Ambulatory Visit
Admission: RE | Admit: 2021-01-15 | Discharge: 2021-01-15 | Disposition: A | Discharge status: Home / Self Care | Payer: 59 | Source: Ambulatory Visit | Attending: Family Medicine | Admitting: Family Medicine

## 2021-01-15 DIAGNOSIS — K219 Gastro-esophageal reflux disease without esophagitis: Secondary | ICD-10-CM | POA: Diagnosis not present

## 2021-01-15 DIAGNOSIS — E1169 Type 2 diabetes mellitus with other specified complication: Secondary | ICD-10-CM | POA: Diagnosis not present

## 2021-01-15 DIAGNOSIS — R059 Cough, unspecified: Secondary | ICD-10-CM

## 2021-01-15 DIAGNOSIS — I2510 Atherosclerotic heart disease of native coronary artery without angina pectoris: Secondary | ICD-10-CM | POA: Diagnosis not present

## 2021-01-15 DIAGNOSIS — E669 Obesity, unspecified: Secondary | ICD-10-CM | POA: Diagnosis not present

## 2021-01-15 DIAGNOSIS — F17200 Nicotine dependence, unspecified, uncomplicated: Secondary | ICD-10-CM | POA: Diagnosis not present

## 2021-01-15 DIAGNOSIS — Z0000 Encounter for general adult medical examination without abnormal findings: Secondary | ICD-10-CM | POA: Diagnosis not present

## 2021-01-15 DIAGNOSIS — Z853 Personal history of malignant neoplasm of breast: Secondary | ICD-10-CM | POA: Diagnosis not present

## 2021-01-15 DIAGNOSIS — Z23 Encounter for immunization: Secondary | ICD-10-CM | POA: Diagnosis not present

## 2021-01-15 DIAGNOSIS — I10 Essential (primary) hypertension: Secondary | ICD-10-CM | POA: Diagnosis not present

## 2021-01-15 DIAGNOSIS — E7800 Pure hypercholesterolemia, unspecified: Secondary | ICD-10-CM | POA: Diagnosis not present

## 2021-01-15 MED ORDER — JARDIANCE 10 MG PO TABS
ORAL_TABLET | ORAL | 1 refills | Status: DC
  Filled 2021-01-15: qty 90, 90d supply, fill #0

## 2021-01-15 MED ORDER — METFORMIN HCL 500 MG PO TABS
ORAL_TABLET | ORAL | 1 refills | Status: DC
  Filled 2021-01-15: qty 180, 90d supply, fill #0

## 2021-01-15 MED ORDER — CARVEDILOL 25 MG PO TABS
ORAL_TABLET | ORAL | 1 refills | Status: DC
  Filled 2021-01-15: qty 180, 90d supply, fill #0

## 2021-01-15 MED ORDER — ATORVASTATIN CALCIUM 40 MG PO TABS
ORAL_TABLET | ORAL | 1 refills | Status: DC
  Filled 2021-01-15: qty 45, 90d supply, fill #0

## 2021-01-15 MED ORDER — OMEPRAZOLE 40 MG PO CPDR
DELAYED_RELEASE_CAPSULE | ORAL | 0 refills | Status: DC
  Filled 2021-01-15: qty 30, 30d supply, fill #0

## 2021-01-15 MED ORDER — VALSARTAN 80 MG PO TABS
ORAL_TABLET | ORAL | 1 refills | Status: DC
  Filled 2021-01-15: qty 90, 90d supply, fill #0

## 2021-01-15 MED ORDER — FLUCONAZOLE 150 MG PO TABS
ORAL_TABLET | ORAL | 5 refills | Status: DC
  Filled 2021-01-15: qty 2, 6d supply, fill #0

## 2021-01-15 MED FILL — Fluconazole Tab 150 MG: ORAL | 2 days supply | Qty: 2 | Fill #0 | Status: AC

## 2021-01-24 ENCOUNTER — Other Ambulatory Visit (HOSPITAL_COMMUNITY): Payer: Self-pay

## 2021-01-24 MED ORDER — JARDIANCE 25 MG PO TABS
ORAL_TABLET | ORAL | 1 refills | Status: DC
  Filled 2021-01-24: qty 90, 90d supply, fill #0
  Filled 2021-04-29: qty 90, 90d supply, fill #1

## 2021-01-27 MED FILL — Ticagrelor Tab 60 MG: ORAL | 30 days supply | Qty: 60 | Fill #0 | Status: AC

## 2021-01-28 ENCOUNTER — Other Ambulatory Visit (HOSPITAL_COMMUNITY): Payer: Self-pay

## 2021-02-08 ENCOUNTER — Inpatient Hospital Stay: Payer: 59

## 2021-02-08 ENCOUNTER — Other Ambulatory Visit: Payer: Self-pay

## 2021-02-13 ENCOUNTER — Other Ambulatory Visit (HOSPITAL_COMMUNITY): Payer: Self-pay

## 2021-02-13 ENCOUNTER — Ambulatory Visit: Payer: 59 | Admitting: Cardiology

## 2021-02-13 ENCOUNTER — Other Ambulatory Visit: Payer: Self-pay

## 2021-02-13 ENCOUNTER — Encounter: Payer: Self-pay | Admitting: Cardiology

## 2021-02-13 VITALS — BP 122/78 | HR 77 | Ht 65.0 in | Wt 188.4 lb

## 2021-02-13 DIAGNOSIS — E1169 Type 2 diabetes mellitus with other specified complication: Secondary | ICD-10-CM | POA: Diagnosis not present

## 2021-02-13 DIAGNOSIS — I10 Essential (primary) hypertension: Secondary | ICD-10-CM | POA: Diagnosis not present

## 2021-02-13 DIAGNOSIS — E785 Hyperlipidemia, unspecified: Secondary | ICD-10-CM | POA: Diagnosis not present

## 2021-02-13 DIAGNOSIS — R058 Other specified cough: Secondary | ICD-10-CM

## 2021-02-13 DIAGNOSIS — I2510 Atherosclerotic heart disease of native coronary artery without angina pectoris: Secondary | ICD-10-CM

## 2021-02-13 DIAGNOSIS — T464X5A Adverse effect of angiotensin-converting-enzyme inhibitors, initial encounter: Secondary | ICD-10-CM | POA: Diagnosis not present

## 2021-02-13 DIAGNOSIS — I252 Old myocardial infarction: Secondary | ICD-10-CM

## 2021-02-13 MED ORDER — AMLODIPINE BESYLATE 5 MG PO TABS
5.0000 mg | ORAL_TABLET | Freq: Every day | ORAL | 3 refills | Status: DC
  Filled 2021-02-13: qty 90, 90d supply, fill #0
  Filled 2021-05-19: qty 90, 90d supply, fill #1
  Filled 2021-09-04: qty 90, 90d supply, fill #2

## 2021-02-13 NOTE — Patient Instructions (Addendum)
Medication Instructions:   STOP TAKING VALSARTAN    START TAKING AMLODIPINE 5 MG ONE TABLET DAILY    *If you need a refill on your cardiac medications before your next appointment, please call your pharmacy*   Lab Work:  NOT NEEDED.   Testing/Procedures:  NOT NEEDED  Follow-Up: At [facility], you and your health needs are our priority.  As part of our continuing mission to provide you with exceptional heart care, we have created designated Provider Care Teams.  These Care Teams include your primary Cardiologist (physician) and Advanced Practice Providers (APPs -  Physician Assistants and Nurse Practitioners) who all work together to provide you with the care you need, when you need it.     Your next appointment:   12 month(s)  The format for your next appointment:   In Person  Provider:   Glenetta Hew, MD

## 2021-02-13 NOTE — Progress Notes (Signed)
Primary Care Provider: Lois Huxley, PA Cardiologist: Glenetta Hew, MD Electrophysiologist: None  Clinic Note: Chief Complaint  Patient presents with  . Follow-up    Annual.  No complaints.  Husband passed away in 2023-08-09 after complications of [id number].  . Coronary Artery Disease    No angina.  . Hypertension    Had a chronic cough thought to be related to ACE inhibitor.  Changed initially to losartan and then to valsartan.  Still has cough.   ===================================  ASSESSMENT/PLAN   Problem List Items Addressed This Visit    Cough due to angiotensin-converting enzyme inhibitor    Cough improved some memory switched from lisinopril to eventually valsartan, however she still has persistent cough. She has been started on omeprazole for 30 days with some improvement, I suspect that she may require more potent proton pump inhibitor.  Also need to consider the possibility of postnasal drip or reactive airways disease.  To ensure that there is no ARB cross-reactivity, we will discontinue valsartan in lieu of amlodipine.       Relevant Medications   amLODipine (NORVASC) 5 MG tablet   Coronary artery disease involving native heart without angina pectoris - Primary (Chronic)    Stable since 2014 with no recurrent anginal symptoms.  Remains active, but no routine exercise.  On maintenance dose Brilinta, moderate dose atorvastatin along with carvedilol.  Plan:  Remains on maintenance dose 60 mg twice daily Brilinta without aspirin.Faythe Ghee to hold Brilinta 5 days (7 days for neurologic/spinal procedures preop for surgeries or procedures.  Continue current dose of carvedilol along with atorvastatin.  Converting from ARB to amlodipine because of continued cough.      Relevant Medications   amLODipine (NORVASC) 5 MG tablet   Other Relevant Orders   EKG 12-Lead (Completed)   History of MI (myocardial infarction) (Chronic)    History of LCx PCI in 2014 in the  setting of an MI.  Known occluded nondominant RCA with normal LAD. Preserved EF on echo with no wall motion normality suggesting no significant scar/infarct.  No recurrent angina or heart failure symptoms.      Relevant Orders   EKG 12-Lead (Completed)   Hyperlipidemia associated with type 2 diabetes mellitus (HCC) (Chronic)    Lipids well controlled with an LDL of 37 on current dose of atorvastatin. Borderline triglycerides.  Likely related to diabetes.  Is on metformin and Jardiance.  A1c was 7.9.  Not at goal.  I suspect if sugars are better controlled, her triglycerides will also improve.  Defer to PCP.      Essential hypertension (Chronic)    Blood pressure looks really well controlled on carvedilol and valsartan.  Unfortunately, she still has cough which may or may not be related to ARB in general.  In the past, she was on thiazide diuretic that was discontinued because of electrolyte abnormalities.  Plan: DC valsartan-start amlodipine 5 mg daily.      Relevant Medications   amLODipine (NORVASC) 5 MG tablet     ===================================  HPI:    Cindy Weber is a 60 y.o. female with a history of CAD-MI in 2014 (PCI x2) below who presents today for annual follow-up. She transferred her care from [facility] here to [facility].  Cindy Weber was last seen on February 01, 2020.  She is doing very well.  No cardiac symptoms.  Thankfully had avoided getting sick with COVID when the rest of her family did.  No  medication changes.  Recent Hospitalizations: None  Reviewed  CV studies:    The following studies were reviewed today: (if available, images/films reviewed: From Epic Chart or Care Everywhere) . None:   Interval History:   Cindy Weber returns for follow-up overall doing pretty well.  She is still somewhat "reeling from her husband's death".  She had COVID but really did not have any significant symptoms.  She said that he was  quite debilitated, and likely just could not handle the COVID infection. She is also upset because her brother recently underwent LVAD placement.  He is either bridge for transplant but it may also be destination therapy.  Besides having your persistent cough, she really has not had any significant cardiac symptoms.  A little improvement having converted to valsartan, but now is coming back.  Ever since her husband was sick, she got out of the habit of doing her routine exercise.  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  She is considering selling the house to one of their sons.  She is little bit more deconditioned and they get short of breath if she overexerts now but not associated with chest pain or pressure.  No palpitations.  No syncope or near syncope.  CV Review of Symptoms (Summary) Cardiovascular ROS: no chest pain or dyspnea on exertion positive for - Maybe little distant exertion because of her deconditioning negative for - edema, irregular heartbeat, loss of consciousness, orthopnea, palpitations, paroxysmal nocturnal dyspnea, rapid heart rate, shortness of breath or Lightheadedness, dizziness or near syncope.  TIA/amaurosis fugax, claudication  REVIEWED OF SYSTEMS   Review of Systems  Constitutional: Negative for malaise/fatigue (Not as energetic, but not really fatigued.) and weight loss.  HENT: Negative for congestion and nosebleeds.   Respiratory: Positive for cough and wheezing (Rare). Negative for sputum production and shortness of breath.   Gastrointestinal: Negative for abdominal pain, blood in stool, constipation and melena.  Genitourinary: Negative for hematuria.  Musculoskeletal: Negative for falls and joint pain.  Neurological: Negative for dizziness, focal weakness and weakness.  Endo/Heme/Allergies: Positive for environmental allergies. Does not bruise/bleed easily.  Psychiatric/Behavioral:  Negative for depression (She is not really depressed with grieving -> just somewhat overwhelmed with all of the new tasks) and memory loss. The patient has insomnia (Has not been sleeping well since her husband's death.). The patient is not nervous/anxious.    I have reviewed and (if needed) personally updated the patient's problem list, medications, allergies, past medical and surgical history, social and family history.   PAST MEDICAL HISTORY   Past Medical History:  Diagnosis Date  . Breast cancer in female [facility]) 2013   - Lumpectomy & LN resection.  Chemo -- > residual hair loss (eye brows & hair);   Marland Kitchen Coronary artery disease involving native heart with angina pectoris ([place]) 02/2013   Non-STEMI - 100% CTO RCA (small codominant posterior and.; Normal LAD. The distal circumflex 95% stenosis - PCI with DES 2 (Xience expedition stents overlapping -2.25 mm x 15 mm and 2.5 mm x 23 mm both postdilated 2.5 mm balloon)  . DM (diabetes mellitus), type 2 with complications (HCC)    CAD - MI.   . Essential hypertension   . Hyperlipidemia associated with type 2 diabetes mellitus ([place])   . Non-STEMI (non-ST elevated myocardial infarction) ([place]) 2014   PCI - (2 stents) -formerly cared for by Dr. [facility].  PAST SURGICAL HISTORY   Past Surgical History:  Procedure Laterality Date  . BREAST LUMPECTOMY Left 2014  . CARDIAC CATHETERIZATION  02/15/2013   [facility] (Dr. Daiva Huge): Codominant system.  100% CTO of small RCA.  d Cx 95%, normal LAD. -> PCI of dCx  . LEFT OOPHORECTOMY  1998  . PERCUTANEOUS CORONARY STENT INTERVENTION (PCI-S)  02/16/2013   [facility] -(Dr. Daiva Huge): PCI [place] 95%,- Xience expedition DES 2.25 mm x 15 mm overlap approximately with a Xience exhibition DES 2.5 mm x 23 mm -postdilated to 2.5 mm throughout  . TRANSTHORACIC ECHOCARDIOGRAM  02/15/2013   [facility]: Normal LV Size & function. EF ~55-60%.  GR1DD. Mild MR & LAE.     Immunization History  Administered Date(s)  Administered  . Influenza Split 07/13/2018, 06/26/2020  . Influenza, Seasonal, Injecte, Preservative Fre 07/19/2016  . PFIZER(Purple Top)SARS-COV-2 Vaccination 10/03/2019, 10/22/2019, 07/21/2020  . Pneumococcal Polysaccharide-23 09/17/2018  . Td 08/11/1989  . Tdap 01/15/2021    MEDICATIONS/ALLERGIES   Current Meds  Medication Sig  . amLODipine (NORVASC) 5 MG tablet Take 1 tablet (5 mg total) by mouth daily.  Marland Kitchen atorvastatin (LIPITOR) 40 MG tablet TAKE 1/2 TABLET BY MOUTH ONCE A DAY  . carvedilol (COREG) 25 MG tablet TAKE 1 TABLET BY MOUTH TWO TIMES DAILY  . cetirizine (ZYRTEC) 10 MG tablet 1 tablet  . empagliflozin (JARDIANCE) 25 MG TABS tablet Take 1 tablet by mouth once a day  . fluconazole (DIFLUCAN) 150 MG tablet TAKE 1 TABLET BY MOUTH ONCE NOW AND 1 TABLET IN 48 HOURS  . Ibuprofen-Famotidine (DUEXIS) 800-26.6 MG TABS Take 1 tablet by mouth 2 (two) times daily as needed.  Marland Kitchen letrozole (FEMARA) 2.5 MG tablet Take 1 tablet by mouth daily.  . metFORMIN (GLUCOPHAGE) 500 MG tablet Take 1 tablet by mouth 2 (two) times daily.  Marland Kitchen omeprazole (PRILOSEC) 20 MG capsule Take 20 mg by mouth daily.  Marland Kitchen omeprazole (PRILOSEC) 40 MG capsule Take 1 capsule by mouth daily.  . ticagrelor (BRILINTA) 60 MG TABS tablet TAKE 1 TABLET BY MOUTH TWO TIMES DAILY (NEEDS ANNUAL VISIT)  . valACYclovir (VALTREX) 1000 MG tablet Take 1 tablet (1,000 mg total) by mouth 3 (three) times daily.  . vitamin B-12 (CYANOCOBALAMIN) 500 MCG tablet Take 1 tablet by mouth daily.  . [DISCONTINUED] valsartan (DIOVAN) 80 MG tablet Take 1 tablet by mouth daily.    Allergies  Allergen Reactions  . Lisinopril     Other reaction(s): cough  . Losartan Potassium     Other reaction(s): cuogh  . Valsartan Cough  . Other Rash    Pt reports that hair and skin dye burned her skin.    SOCIAL HISTORY/FAMILY HISTORY   Reviewed in Epic:  Pertinent findings: Social History Narrative updated Social History   Tobacco Use  . Smoking  status: Current Every Day Smoker    Types: Cigarettes  . Smokeless tobacco: Never Used   Social History   Social History Narrative   She is a recent widow.     Her husband, Montine Circle (a retired Network engineer) died on October 31, [id number]. ->  Complications of [id number].  He has been very disabled up to that point, and simply decompensated.   -> She is now working on try to downsize the house.  Trying to get the estate together.      She smokes about half pack a day and has done so for many years. No recreational drug use. Rare alcohol use.   She and Vicente Males had 3 children  aged 34, 51 and 37 (as of 2021)   She currently works as a Chartered certified accountant for [employer], EKG, labs   Wt Readings from Last 3 Encounters:  02/13/21 188 lb 6.4 oz (85.5 kg)  02/01/20 192 lb (87.1 kg)  11/05/18 191 lb (86.6 kg)    Physical Exam: BP 122/78 (BP Location: Right Arm)   Pulse 77   Ht 5\' 5"  (1.651 m)   Wt 188 lb 6.4 oz (85.5 kg)   BMI 31.35 kg/m  Physical Exam Constitutional:      General: She is not in acute distress.    Appearance: Normal appearance. She is obese. She is not ill-appearing or toxic-appearing.  HENT:     Head: Normocephalic and atraumatic.  Neck:     Vascular: No carotid bruit.  Cardiovascular:     Rate and Rhythm: Normal rate and regular rhythm.     Pulses: Normal pulses.     Heart sounds: Normal heart sounds. No murmur heard. No gallop.   Pulmonary:     Effort: Pulmonary effort is normal.     Breath sounds: Normal breath sounds. No wheezing, rhonchi or rales.  Chest:     Chest wall: No tenderness.  Musculoskeletal:        General: No swelling or deformity. Normal range of motion.     Cervical back: Normal range of motion and neck supple.  Skin:    General: Skin is warm and dry.  Neurological:     General: No focal deficit present.     Mental Status: She is alert and oriented to person, place, and time.  Psychiatric:        Mood and Affect: Mood normal.         Behavior: Behavior normal.        Thought Content: Thought content normal.        Judgment: Judgment normal.      Adult ECG Report  Rate: 77 ;  Rhythm: normal sinus rhythm and Minimal voltage for LVH. Otherwise normal axis, intervals and durations.;   Narrative Interpretation: Stable  Recent Labs:    01/15/2021: TC 84, TG 157, HDL 21, LDL 37.  A1c 7.9.  Hgb 4.7, BUN 10, Cr 0.69, K+ 4.6, AST 12, ALT 19, TSH 0.53.  Glucose 180. No results found for: CHOL, HDL, LDLCALC, LDLDIRECT, TRIG, CHOLHDL Lab Results  Component Value Date   CREATININE 0.70 03/21/2019   BUN 14 03/21/2019   NA 138 03/21/2019   K 4.1 03/21/2019   CL 109 03/21/2019   CO2 19 (L) 03/21/2019   CBC Latest Ref Rng & Units 03/21/2019 03/21/2019  WBC 4.0 - 10.5 K/uL - 8.0  Hemoglobin 12.0 - 15.0 g/dL 15.6(H) 14.9  Hematocrit 36.0 - 46.0 % 46.0 45.9  Platelets 150 - 400 K/uL - 211    No results found for: TSH  ==================================================  COVID-19 Education: The signs and symptoms of COVID-19 were discussed with the patient and how to seek care for testing (follow up with PCP or arrange E-visit).    I spent a total of 24minutes with the patient spent in direct patient consultation.  Additional time spent with chart review  / charting (studies, outside notes, etc): 12 min Total Time: 47 min   Current medicines are reviewed at length with the patient today.  (+/- concerns) n/a  This visit occurred during the SARS-CoV-2 public health emergency.  Safety protocols were in place, including screening questions prior to  the visit, additional usage of staff PPE, and extensive cleaning of exam room while observing appropriate contact time as indicated for disinfecting solutions.  Notice: This dictation was prepared with Dragon dictation along with smaller phrase technology. Any transcriptional errors that result from this process are unintentional and may not be corrected upon review.  Patient  Instructions / Medication Changes & Studies & Tests Ordered   Patient Instructions  Medication Instructions:   STOP TAKING VALSARTAN    START TAKING AMLODIPINE 5 MG ONE TABLET DAILY    *If you need a refill on your cardiac medications before your next appointment, please call your pharmacy*   Lab Work:  NOT NEEDED.   Testing/Procedures:  NOT NEEDED  Follow-Up: At [facility], you and your health needs are our priority.  As part of our continuing mission to provide you with exceptional heart care, we have created designated Provider Care Teams.  These Care Teams include your primary Cardiologist (physician) and Advanced Practice Providers (APPs -  Physician Assistants and Nurse Practitioners) who all work together to provide you with the care you need, when you need it.     Your next appointment:   12 month(s)  The format for your next appointment:   In Person  Provider:   Glenetta Hew, MD  Studies Ordered:   Orders Placed This Encounter  Procedures  . EKG 12-Lead     Glenetta Hew, M.D., M.S. Interventional Cardiologist   Pager # [phone] Phone # [phone] [address] [place], [place] [address]   Thank you for choosing Heartcare at [facility]!!

## 2021-02-25 ENCOUNTER — Other Ambulatory Visit (HOSPITAL_COMMUNITY): Payer: Self-pay

## 2021-02-25 MED FILL — Fluconazole Tab 150 MG: ORAL | 2 days supply | Qty: 2 | Fill #1 | Status: AC

## 2021-02-25 MED FILL — Ticagrelor Tab 60 MG: ORAL | 30 days supply | Qty: 60 | Fill #1 | Status: AC

## 2021-03-10 ENCOUNTER — Encounter: Payer: Self-pay | Admitting: Cardiology

## 2021-03-10 NOTE — Assessment & Plan Note (Signed)
Blood pressure looks really well controlled on carvedilol and valsartan.  Unfortunately, she still has cough which may or may not be related to ARB in general.  In the past, she was on thiazide diuretic that was discontinued because of electrolyte abnormalities.  Plan: DC valsartan-start amlodipine 5 mg daily.

## 2021-03-10 NOTE — Assessment & Plan Note (Signed)
Cough improved some memory switched from lisinopril to eventually valsartan, however she still has persistent cough. She has been started on omeprazole for 30 days with some improvement, I suspect that she may require more potent proton pump inhibitor.  Also need to consider the possibility of postnasal drip or reactive airways disease.  To ensure that there is no ARB cross-reactivity, we will discontinue valsartan in lieu of amlodipine.

## 2021-03-10 NOTE — Assessment & Plan Note (Signed)
History of LCx PCI in 2014 in the setting of an MI.  Known occluded nondominant RCA with normal LAD. Preserved EF on echo with no wall motion normality suggesting no significant scar/infarct.  No recurrent angina or heart failure symptoms.

## 2021-03-10 NOTE — Assessment & Plan Note (Signed)
Lipids well controlled with an LDL of 37 on current dose of atorvastatin. Borderline triglycerides.  Likely related to diabetes.  Is on metformin and Jardiance.  A1c was 7.9.  Not at goal.  I suspect if sugars are better controlled, her triglycerides will also improve.  Defer to PCP.

## 2021-03-10 NOTE — Assessment & Plan Note (Signed)
Stable since 2014 with no recurrent anginal symptoms.  Remains active, but no routine exercise.  On maintenance dose Brilinta, moderate dose atorvastatin along with carvedilol.  Plan:  Remains on maintenance dose 60 mg twice daily Brilinta without aspirin.Faythe Ghee to hold Brilinta 5 days (7 days for neurologic/spinal procedures preop for surgeries or procedures.  Continue current dose of carvedilol along with atorvastatin.  Converting from ARB to amlodipine because of continued cough.

## 2021-03-20 ENCOUNTER — Other Ambulatory Visit: Payer: Self-pay

## 2021-03-20 ENCOUNTER — Inpatient Hospital Stay: Payer: 59 | Attending: Family Medicine

## 2021-03-20 DIAGNOSIS — Z23 Encounter for immunization: Secondary | ICD-10-CM

## 2021-03-20 NOTE — Progress Notes (Signed)
   Covid-19 Vaccination Clinic  Name:  Cindy Weber    MRN: [id number] DOB: 1961/08/16  03/20/2021  [name] was observed post Covid-19 immunization for 15 minutes without incident. She was provided with Vaccine Information Sheet and instruction to access the V-Safe system.   [name] was instructed to call 911 with any severe reactions post vaccine: Marland Kitchen Difficulty breathing  . Swelling of face and throat  . A fast heartbeat  . A bad rash all over body  . Dizziness and weakness   Immunizations Administered    Name Date Dose VIS Date Route   PFIZER Comrnaty(Gray TOP) Covid-19 Vaccine 03/20/2021  4:05 PM 0.3 mL 09/20/2020 Intramuscular   Manufacturer: [employer]   Lot: [id number]   [place]: [phone]

## 2021-03-28 ENCOUNTER — Other Ambulatory Visit (HOSPITAL_COMMUNITY): Payer: Self-pay

## 2021-03-29 ENCOUNTER — Other Ambulatory Visit: Payer: Self-pay | Admitting: Cardiology

## 2021-03-29 ENCOUNTER — Other Ambulatory Visit (HOSPITAL_COMMUNITY): Payer: Self-pay

## 2021-03-29 MED ORDER — OMEPRAZOLE 40 MG PO CPDR
DELAYED_RELEASE_CAPSULE | ORAL | 1 refills | Status: DC
  Filled 2021-03-29: qty 90, 90d supply, fill #0
  Filled 2021-06-26: qty 90, 90d supply, fill #1

## 2021-03-29 MED ORDER — TICAGRELOR 60 MG PO TABS
ORAL_TABLET | ORAL | 11 refills | Status: DC
  Filled 2021-03-29: qty 60, 30d supply, fill #0
  Filled 2021-05-06: qty 60, 30d supply, fill #1
  Filled 2021-06-11 – 2021-06-26 (×2): qty 60, 30d supply, fill #2
  Filled 2021-07-31: qty 60, 30d supply, fill #3
  Filled 2021-08-29: qty 60, 30d supply, fill #4
  Filled 2021-10-04: qty 60, 30d supply, fill #5
  Filled 2021-11-02: qty 60, 30d supply, fill #6
  Filled 2021-12-01: qty 60, 30d supply, fill #7
  Filled 2022-01-05: qty 60, 30d supply, fill #8
  Filled 2022-02-11: qty 60, 30d supply, fill #9
  Filled 2022-03-16: qty 60, 30d supply, fill #10

## 2021-04-11 ENCOUNTER — Other Ambulatory Visit (HOSPITAL_COMMUNITY): Payer: Self-pay

## 2021-04-11 MED ORDER — FREESTYLE LITE W/DEVICE KIT
PACK | 0 refills | Status: DC
  Filled 2021-04-11: qty 1, 30d supply, fill #0

## 2021-04-11 MED ORDER — FREESTYLE LANCETS MISC
3 refills | Status: DC
  Filled 2021-04-11: qty 200, 90d supply, fill #0

## 2021-04-11 MED ORDER — GLUCOSE BLOOD VI STRP
ORAL_STRIP | 3 refills | Status: DC
  Filled 2021-04-11: qty 200, 90d supply, fill #0
  Filled 2021-07-31: qty 200, 90d supply, fill #1

## 2021-04-29 ENCOUNTER — Other Ambulatory Visit (HOSPITAL_COMMUNITY): Payer: Self-pay

## 2021-05-06 ENCOUNTER — Other Ambulatory Visit (HOSPITAL_COMMUNITY): Payer: Self-pay

## 2021-05-09 ENCOUNTER — Other Ambulatory Visit (HOSPITAL_COMMUNITY): Payer: Self-pay

## 2021-05-10 ENCOUNTER — Other Ambulatory Visit (HOSPITAL_COMMUNITY): Payer: Self-pay

## 2021-05-10 MED ORDER — FLUCONAZOLE 150 MG PO TABS
ORAL_TABLET | ORAL | 3 refills | Status: DC
  Filled 2021-05-10: qty 2, 2d supply, fill #0
  Filled 2021-10-28: qty 2, 2d supply, fill #1
  Filled 2022-03-17: qty 2, 2d supply, fill #2

## 2021-05-14 ENCOUNTER — Other Ambulatory Visit (HOSPITAL_COMMUNITY): Payer: Self-pay

## 2021-05-15 ENCOUNTER — Other Ambulatory Visit (HOSPITAL_COMMUNITY): Payer: Self-pay

## 2021-05-15 MED ORDER — METFORMIN HCL 500 MG PO TABS
ORAL_TABLET | ORAL | 0 refills | Status: DC
  Filled 2021-05-15: qty 180, 90d supply, fill #0

## 2021-05-20 ENCOUNTER — Other Ambulatory Visit (HOSPITAL_COMMUNITY): Payer: Self-pay

## 2021-05-20 MED FILL — Carvedilol Tab 25 MG: ORAL | 90 days supply | Qty: 180 | Fill #0 | Status: AC

## 2021-05-20 MED FILL — Atorvastatin Calcium Tab 40 MG (Base Equivalent): ORAL | 90 days supply | Qty: 45 | Fill #0 | Status: AC

## 2021-05-21 ENCOUNTER — Other Ambulatory Visit (HOSPITAL_COMMUNITY): Payer: Self-pay

## 2021-05-24 ENCOUNTER — Other Ambulatory Visit (HOSPITAL_COMMUNITY): Payer: Self-pay

## 2021-05-24 DIAGNOSIS — I2510 Atherosclerotic heart disease of native coronary artery without angina pectoris: Secondary | ICD-10-CM | POA: Diagnosis not present

## 2021-05-24 DIAGNOSIS — E1169 Type 2 diabetes mellitus with other specified complication: Secondary | ICD-10-CM | POA: Diagnosis not present

## 2021-05-24 DIAGNOSIS — I25119 Atherosclerotic heart disease of native coronary artery with unspecified angina pectoris: Secondary | ICD-10-CM | POA: Diagnosis not present

## 2021-05-24 DIAGNOSIS — Z853 Personal history of malignant neoplasm of breast: Secondary | ICD-10-CM | POA: Diagnosis not present

## 2021-05-24 DIAGNOSIS — E7800 Pure hypercholesterolemia, unspecified: Secondary | ICD-10-CM | POA: Diagnosis not present

## 2021-05-24 DIAGNOSIS — K219 Gastro-esophageal reflux disease without esophagitis: Secondary | ICD-10-CM | POA: Diagnosis not present

## 2021-05-24 DIAGNOSIS — I10 Essential (primary) hypertension: Secondary | ICD-10-CM | POA: Diagnosis not present

## 2021-05-24 DIAGNOSIS — E669 Obesity, unspecified: Secondary | ICD-10-CM | POA: Diagnosis not present

## 2021-05-24 DIAGNOSIS — F17200 Nicotine dependence, unspecified, uncomplicated: Secondary | ICD-10-CM | POA: Diagnosis not present

## 2021-05-24 MED ORDER — FLUCONAZOLE 150 MG PO TABS
ORAL_TABLET | ORAL | 2 refills | Status: DC
  Filled 2021-05-24: qty 4, 4d supply, fill #0
  Filled 2021-09-04: qty 4, 4d supply, fill #1
  Filled 2021-12-24: qty 4, 4d supply, fill #2

## 2021-05-27 ENCOUNTER — Other Ambulatory Visit (HOSPITAL_COMMUNITY): Payer: Self-pay

## 2021-05-27 MED ORDER — RYBELSUS 3 MG PO TABS
ORAL_TABLET | ORAL | 0 refills | Status: DC
  Filled 2021-05-27: qty 30, 30d supply, fill #0

## 2021-05-27 MED ORDER — RYBELSUS 7 MG PO TABS
ORAL_TABLET | ORAL | 4 refills | Status: DC
  Filled 2021-06-26: qty 30, 30d supply, fill #0
  Filled 2021-07-31: qty 30, 30d supply, fill #1
  Filled 2021-09-02: qty 30, 30d supply, fill #2
  Filled 2021-10-04: qty 30, 30d supply, fill #3
  Filled 2021-11-02: qty 30, 30d supply, fill #4

## 2021-06-11 ENCOUNTER — Other Ambulatory Visit (HOSPITAL_COMMUNITY): Payer: Self-pay

## 2021-06-19 ENCOUNTER — Other Ambulatory Visit (HOSPITAL_COMMUNITY): Payer: Self-pay

## 2021-06-26 ENCOUNTER — Other Ambulatory Visit (HOSPITAL_COMMUNITY): Payer: Self-pay

## 2021-07-19 ENCOUNTER — Institutional Professional Consult (permissible substitution): Payer: 59 | Admitting: Internal Medicine

## 2021-07-22 ENCOUNTER — Ambulatory Visit: Payer: 59

## 2021-07-26 ENCOUNTER — Other Ambulatory Visit (HOSPITAL_COMMUNITY): Payer: Self-pay

## 2021-07-27 ENCOUNTER — Other Ambulatory Visit (HOSPITAL_COMMUNITY): Payer: Self-pay

## 2021-07-27 MED ORDER — JARDIANCE 25 MG PO TABS
ORAL_TABLET | ORAL | 1 refills | Status: DC
  Filled 2021-07-27: qty 90, 90d supply, fill #0
  Filled 2021-10-28: qty 90, 90d supply, fill #1

## 2021-07-29 ENCOUNTER — Other Ambulatory Visit (HOSPITAL_COMMUNITY): Payer: Self-pay

## 2021-07-31 ENCOUNTER — Other Ambulatory Visit (HOSPITAL_COMMUNITY): Payer: Self-pay

## 2021-07-31 MED ORDER — METFORMIN HCL 500 MG PO TABS
ORAL_TABLET | ORAL | 1 refills | Status: DC
  Filled 2021-07-31: qty 180, 90d supply, fill #0
  Filled 2021-11-25: qty 180, 90d supply, fill #1

## 2021-08-01 ENCOUNTER — Other Ambulatory Visit: Payer: Self-pay

## 2021-08-01 ENCOUNTER — Encounter: Payer: Self-pay | Admitting: Internal Medicine

## 2021-08-01 ENCOUNTER — Other Ambulatory Visit (HOSPITAL_COMMUNITY): Payer: Self-pay

## 2021-08-01 ENCOUNTER — Ambulatory Visit (INDEPENDENT_AMBULATORY_CARE_PROVIDER_SITE_OTHER): Payer: 59 | Admitting: Internal Medicine

## 2021-08-01 VITALS — BP 136/76 | HR 81 | Temp 97.8°F | Ht 65.0 in | Wt 189.6 lb

## 2021-08-01 DIAGNOSIS — F17210 Nicotine dependence, cigarettes, uncomplicated: Secondary | ICD-10-CM | POA: Diagnosis not present

## 2021-08-01 DIAGNOSIS — R0602 Shortness of breath: Secondary | ICD-10-CM

## 2021-08-01 DIAGNOSIS — F17200 Nicotine dependence, unspecified, uncomplicated: Secondary | ICD-10-CM

## 2021-08-01 MED ORDER — BUPROPION HCL ER (SR) 150 MG PO TB12
150.0000 mg | ORAL_TABLET | Freq: Two times a day (BID) | ORAL | 5 refills | Status: DC
Start: 2021-08-01 — End: 2022-02-17
  Filled 2021-08-01: qty 60, 30d supply, fill #0
  Filled 2021-08-29: qty 60, 30d supply, fill #1
  Filled 2021-10-04: qty 60, 30d supply, fill #2
  Filled 2021-11-02: qty 60, 30d supply, fill #3
  Filled 2021-12-01: qty 60, 30d supply, fill #4
  Filled 2022-01-05: qty 60, 30d supply, fill #5

## 2021-08-01 MED ORDER — ALBUTEROL SULFATE HFA 108 (90 BASE) MCG/ACT IN AERS
2.0000 | INHALATION_SPRAY | Freq: Four times a day (QID) | RESPIRATORY_TRACT | 5 refills | Status: AC | PRN
  Filled 2021-08-01: qty 18, 25d supply, fill #0
  Filled 2021-09-01: qty 18, 25d supply, fill #1
  Filled 2021-10-28: qty 18, 25d supply, fill #2
  Filled 2021-12-09: qty 18, 25d supply, fill #3
  Filled 2022-01-05: qty 18, 25d supply, fill #4
  Filled 2022-02-26: qty 18, 25d supply, fill #5

## 2021-08-01 NOTE — Progress Notes (Signed)
The patient has been prescribed the inhaler albuterol. Inhaler technique was demonstrated to patient. The patient subsequently demonstrated correct technique.  

## 2021-08-01 NOTE — Patient Instructions (Addendum)
Please schedule follow up scheduled with myself in 1 months.  If my schedule is not open yet, we will contact you with a reminder closer to that time.  Before your next visit I would like you to have:  Full set of PFTs (schedule when convenient).  We also do video visits.   Take the albuterol rescue inhaler every 4 to 6 hours as needed for wheezing or shortness of breath. You can also take it 15 minutes before exercise or exertional activity. Side effects include heart racing or pounding, jitters or anxiety. If you have a history of an irregular heart rhythm, it can make this worse. Can also give some patients a hard time sleeping.  Bupropion -- Bupropion (brand names: Zyban, Wellbutrin) is an antidepressant that can be used to help you stop smoking.  Start taking it once per day for three days, then increase to twice daily starting four weeks before the quit date.  You should typically continue for 7 to 12 weeks after you quit smoking.  Please do not stop taking this medication abruptly.  When you are ready to stop we will have you go to once daily for two weeks and then you can do once every other day for a week before you stop.   Bupropion is generally well-tolerated, but it may cause dry mouth and difficulty sleeping. The drug should not be used by people who have a seizure disorder or bipolar (manic-depressive) disorder, and it is not recommended for those who have head trauma, anorexia nervosa, or bulimia, or for those who drink alcohol excessively.  What are the benefits of quitting smoking? Quitting smoking can lower your chances of getting or dying from heart disease, lung disease, kidney failure, infection, or cancer. It can also lower your chances of getting osteoporosis, a condition that makes your bones weak. Plus, quitting smoking can help your skin look younger and reduce the chances that you will have problems with sex.  Quitting smoking will improve your health no matter how old  you are, and no matter how long or how much you have smoked.  What should I do if I want to quit smoking? The letters in the word "START" can help you remember the steps to take: S = Set a quit date. T = Tell family, friends, and the people around you that you plan to quit. A = Anticipate or plan ahead for the tough times you'll face while quitting. R = Remove cigarettes and other tobacco products from your home, car, and work. T = Talk to your doctor about getting help to quit.  How can my doctor or nurse help? Your doctor or nurse can give you advice on the best way to quit. He or she can also put you in touch with counselors or other people you can call for support. Plus, your doctor or nurse can give you medicines to: ?Reduce your craving for cigarettes ?Reduce the unpleasant symptoms that happen when you stop smoking (called "withdrawal symptoms"). You can also get help from a free phone line (1-800-QUIT-NOW) or go online to [place].  What are the symptoms of withdrawal? The symptoms include: ?Trouble sleeping ?Being irritable, anxious or restless ?Getting frustrated or angry ?Having trouble thinking clearly  Some people who stop smoking become temporarily depressed. Some people need treatment for depression, such as counseling or antidepressant medicines. Depressed people might: ?No longer enjoy or care about doing the things they used to like to do ?Feel sad, down, hopeless, nervous,  or cranky most of the day, almost every day ?Lose or gain weight ?Sleep too much or too little ?Feel tired or like they have no energy ?Feel guilty or like they are worth nothing ?Forget things or feel confused ?Move and speak more slowly than usual ?Act restless or have trouble staying still ?Think about death or suicide  If you think you might be depressed, see your doctor or nurse. Only someone trained in mental health can tell for sure if you are depressed. If you ever feel like  you might hurt yourself, go straight to the nearest emergency department. Or you can call for an ambulance (in the [place] and [place], [place] 9-1-1) or call your doctor or nurse right away and tell them it is an emergency. You can also reach the [place] National Suicide Prevention Lifeline at 9735207639 or http://walker-sanchez.info/.  How do medicines help you stop smoking? Different medicines work in different ways: ?Nicotine replacement therapy eases withdrawal and reduces your body's craving for nicotine, the main drug found in cigarettes. There are different forms of nicotine replacement, including skin patches, lozenges, gum, nasal sprays, and "puffers" or inhalers. Many can be bought without a prescription, while others might require one. ?Bupropion is a prescription medicine that reduces your desire to smoke. This medicine is sold under the brand names Zyban and Wellbutrin. It is also available in a generic version, which is cheaper than brand name medicines. ?Varenicline (brand names: Chantix, Champix) is a prescription medicine that reduces withdrawal symptoms and cigarette cravings. If you think you'd like to take varenicline and you have a history of depression, anxiety, or heart disease, discuss this with your doctor or nurse before taking the medicine. Varenicline can also increase the effects of alcohol in some people. It's a good idea to limit drinking while you're taking it, at least until you know how it affects you.  How does counseling work? Counseling can happen during formal office visits or just over the phone. A counselor can help you: ?Figure out what triggers your smoking and what to do instead ?Overcome cravings ?Figure out what went wrong when you tried to quit before  What works best? Studies show that people have the best luck at quitting if they take medicines to help them quit and work with a Social worker. It might also be helpful to combine nicotine replacement with one of  the prescription medicines that help people quit. In some cases, it might even make sense to take bupropion and varenicline together.  What about e-cigarettes? Sometimes people wonder if using electronic cigarettes, or "e-cigarettes," might help them quit smoking. Using e-cigarettes is also called "vaping." Doctors do not recommend e-cigarettes in place of medicines and counseling. That's because e-cigarettes still contain nicotine as well as other substances that might be harmful. It's not clear how they can affect a person's health in the long term.  Will I gain weight if I quit? Yes, you might gain a few pounds. But quitting smoking will have a much more positive effect on your health than weighing a few pounds more. Plus, you can help prevent some weight gain by being more active and eating less. Taking the medicine bupropion might help control weight gain.   What else can I do to improve my chances of quitting? You can: ?Start exercising. ?Stay away from smokers and places that you associate with smoking. If people close to you smoke, ask them to quit with you. ?Keep gum, hard candy, or something to put in  your mouth handy. If you get a craving for a cigarette, try one of these instead. ?Don't give up, even if you start smoking again. It takes most people a few tries before they succeed.  What if I am pregnant and I smoke? If you are pregnant, it's really important for the health of your baby that you quit. Ask your doctor what options you have, and what is safest for your baby

## 2021-08-01 NOTE — Progress Notes (Signed)
Cindy Weber    [id number]    [birth year]  Primary Care Physician:Becker, Mancel Bale, PA  Referring Physician: Lois Huxley, [place],  [place] [address] Reason for Consultation: chronic cough Date of Consultation: 08/01/2021  Chief complaint:   Chief Complaint  Patient presents with   Consult    Cough     HPI: Cindy Weber is a 60 y.o. woman who presents with chronic cough with 2 years duration. Past medical history of breast cancer s/p chemo and radiation with lumpectomy, DM2, CAD s/p PCI in 2014 HTN and was on lisinopril but has been off this now for about a year now. Still coughing. Cough worse with talking and wearing a mask for long periods of time.  Cough with thick mucus  Primary care did a chest xray which came back as clear. 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   If she doesn't sleep well, breathing is worse the next day.  She has used her husband's albuterol inhaler and that does make her feel better.   She has never had pneumonia or bronchitis before. She did have childhood asthma, but grew out if it around [age].   Social history:  Occupation: Works in the [employer]: originally from [place], [place]. Moved to [facility] to be closer to family after her husband became disabled(now deceased). Lives at home with youngest son. No pets.  Smoking history: started smoking at 16. 1 ppd. Had stopped for a few years but started up last year after husband died.   Social History   Occupational History   Not on file  Tobacco Use   Smoking status: Every Day    Packs/day: 1.00    Years: 40.00    Pack years: 40.00    Types: Cigarettes   Smokeless tobacco: Never  Substance and Sexual Activity   Alcohol use: Not on file   Drug use: Not on file   Sexual activity: Not on file    Relevant family history:  Family History  Problem Relation Age of Onset   Diabetes Mother     Heart disease Mother    Liver disease Father    Healthy Sister    Hypertension Brother    Diabetes Maternal Grandfather    Diabetes Sister    Hypertension Sister    Heart disease Brother    Heart attack Brother    Diabetes Brother    Hypertension Brother    Hypertension Daughter    Hypertension Son    Diabetes Son    Diabetes Son     Past Medical History:  Diagnosis Date   Breast cancer in female [facility]) 2013   - Lumpectomy & LN resection.  Chemo -- > residual hair loss (eye brows & hair);    Coronary artery disease involving native heart with angina pectoris ([place]) 02/2013   Non-STEMI - 100% CTO RCA (small codominant posterior and.; Normal LAD. The distal circumflex 95% stenosis - PCI with DES 2 (Xience expedition stents overlapping -2.25 mm x 15 mm and 2.5 mm x 23 mm both postdilated 2.5 mm balloon)   DM (diabetes mellitus), type 2 with complications (HCC)    CAD - MI.    Essential hypertension    Hyperlipidemia associated with type 2 diabetes mellitus ([place])    Non-STEMI (non-ST elevated myocardial infarction) ([place]) 2014   PCI - (2 stents) -formerly cared for  by Dr. Redmond Pulling [facility].    Past Surgical History:  Procedure Laterality Date   BREAST LUMPECTOMY Left 2014   CARDIAC CATHETERIZATION  02/15/2013   [facility] (Dr. Daiva Huge): Codominant system.  100% CTO of small RCA.  d Cx 95%, normal LAD. -> PCI of dCx   LEFT OOPHORECTOMY  1998   PERCUTANEOUS CORONARY STENT INTERVENTION (PCI-S)  02/16/2013   [facility] -(Dr. Daiva Huge): PCI dCX 95%,- Xience expedition DES 2.25 mm x 15 mm overlap approximately with a Xience exhibition DES 2.5 mm x 23 mm -postdilated to 2.5 mm throughout   TRANSTHORACIC ECHOCARDIOGRAM  02/15/2013   [facility]: Normal LV Size & function. EF ~55-60%.  GR1DD. Mild MR & LAE.      Physical Exam: Blood pressure 136/76, pulse 81, temperature 97.8 F (36.6 C), temperature source Oral, height 5\' 5"  (1.651 m), weight 189 lb 9.6 oz (86 kg), SpO2 97 %. Gen:      No acute  distress ENT:  mallampati IV, no nasal polyps, mucus membranes moist Lungs:    No increased respiratory effort, symmetric chest wall excursion, clear to auscultation bilaterally, no wheezes or crackles CV:         Regular rate and rhythm; no murmurs, rubs, or gallops.  No pedal edema Abd:      + bowel sounds; soft, non-tender; no distension MSK: no acute synovitis of DIP or PIP joints, no mechanics hands.  Skin:      Warm and dry; no rashes Neuro: normal speech, no focal facial asymmetry Psych: alert and oriented x3, normal mood and affect   Data Reviewed/Medical Decision Making:  Independent interpretation of tests: Imaging:  Review of patient's chest xray April 2022 images revealed no acute pulmonary process. The patient's images have been independently reviewed by me.    PFTs: None on file No flowsheet data found.  Labs:  Lab Results  Component Value Date   WBC 8.0 03/21/2019   HGB 15.6 (H) 03/21/2019   HCT 46.0 03/21/2019   MCV 90.7 03/21/2019   PLT 211 03/21/2019   Lab Results  Component Value Date   NA 138 03/21/2019   K 4.1 03/21/2019   CL 109 03/21/2019   CO2 19 (L) 03/21/2019     Immunization status:  Immunization History  Administered Date(s) Administered   Influenza Split 07/13/2018, 06/26/2020   Influenza, Seasonal, Injecte, Preservative Fre 07/19/2016   Influenza,inj,Quad PF,6-35 Mos 07/16/2021   PFIZER Comirnaty(Gray Top)Covid-19 Tri-Sucrose Vaccine 03/20/2021   PFIZER(Purple Top)SARS-COV-2 Vaccination 10/03/2019, 10/22/2019, 07/21/2020   Pneumococcal Polysaccharide-23 09/17/2018   Td 08/11/1989   Tdap 01/15/2021     I reviewed prior external note(s) from ED, PCP  I reviewed the result(s) of the labs and imaging as noted above.   I have ordered PFT   Assessment:  Shortness of breath Chronic Cough Tobacco use disorder History of breast cancer s/p chemo rads CAD s/p PCI in 2014  Plan/Recommendations:  Concern for COPD. Will obtain PFTs.  Start albuterol prn Start wellbutrin for smoking cessation  Smoking Cessation Counseling:  1. The patient is an everyday smoker and symptomatic due to the following condition COPD 2. The patient is currently contemplativein quitting smoking. 3. I advised patient to quit smoking. 4. We identified patient specific barriers to change.  5. I personally spent 6 minutes counseling the patient regarding tobacco use disorder. 6. We discussed management of stress and anxiety to help with smoking cessation, when applicable. 7. We discussed nicotine replacement therapy, Wellbutrin, Chantix as possible options. 8.  I advised setting a quit date. 9. Follow?up arranged with our office to continue ongoing discussions. 10.Resources given to patient including quit hotline.    We discussed disease management and progression at length today.   Return to Care: Return in about 4 weeks (around 08/29/2021).  Lenice Llamas, MD Pulmonary and [place]  CC: Lois Huxley, [place]

## 2021-08-29 ENCOUNTER — Other Ambulatory Visit (HOSPITAL_COMMUNITY): Payer: Self-pay

## 2021-08-30 ENCOUNTER — Other Ambulatory Visit (HOSPITAL_COMMUNITY): Payer: Self-pay

## 2021-08-30 MED ORDER — CARVEDILOL 25 MG PO TABS
ORAL_TABLET | ORAL | 1 refills | Status: DC
  Filled 2021-08-30: qty 180, 90d supply, fill #0
  Filled 2021-12-14: qty 180, 90d supply, fill #1

## 2021-09-01 ENCOUNTER — Other Ambulatory Visit (HOSPITAL_COMMUNITY): Payer: Self-pay

## 2021-09-02 ENCOUNTER — Other Ambulatory Visit (HOSPITAL_COMMUNITY): Payer: Self-pay

## 2021-09-04 ENCOUNTER — Other Ambulatory Visit (HOSPITAL_COMMUNITY): Payer: Self-pay

## 2021-09-06 ENCOUNTER — Other Ambulatory Visit (HOSPITAL_COMMUNITY): Payer: Self-pay

## 2021-09-17 ENCOUNTER — Encounter: Payer: Self-pay | Admitting: Internal Medicine

## 2021-09-17 ENCOUNTER — Other Ambulatory Visit: Payer: Self-pay

## 2021-09-17 ENCOUNTER — Ambulatory Visit (INDEPENDENT_AMBULATORY_CARE_PROVIDER_SITE_OTHER): Payer: 59 | Admitting: Internal Medicine

## 2021-09-17 ENCOUNTER — Other Ambulatory Visit (HOSPITAL_COMMUNITY): Payer: Self-pay

## 2021-09-17 VITALS — BP 130/78 | HR 89 | Ht 65.0 in | Wt 184.0 lb

## 2021-09-17 DIAGNOSIS — F17200 Nicotine dependence, unspecified, uncomplicated: Secondary | ICD-10-CM

## 2021-09-17 DIAGNOSIS — R0602 Shortness of breath: Secondary | ICD-10-CM | POA: Diagnosis not present

## 2021-09-17 DIAGNOSIS — R053 Chronic cough: Secondary | ICD-10-CM

## 2021-09-17 LAB — PULMONARY FUNCTION TEST
DL/VA % pred: 85 %
DL/VA: 3.57 ml/min/mmHg/L
DLCO cor % pred: 70 %
DLCO cor: 14.72 ml/min/mmHg
DLCO unc % pred: 70 %
DLCO unc: 14.72 ml/min/mmHg
FEF 25-75 Post: 1.58 L/sec
FEF 25-75 Pre: 1.49 L/sec
FEF2575-%Change-Post: 5 %
FEF2575-%Pred-Post: 76 %
FEF2575-%Pred-Pre: 72 %
FEV1-%Change-Post: 0 %
FEV1-%Pred-Post: 93 %
FEV1-%Pred-Pre: 93 %
FEV1-Post: 2 L
FEV1-Pre: 2 L
FEV1FVC-%Change-Post: 0 %
FEV1FVC-%Pred-Pre: 93 %
FEV6-%Change-Post: 0 %
FEV6-%Pred-Post: 102 %
FEV6-%Pred-Pre: 103 %
FEV6-Post: 2.7 L
FEV6-Pre: 2.72 L
FEV6FVC-%Pred-Post: 103 %
FEV6FVC-%Pred-Pre: 103 %
FVC-%Change-Post: 0 %
FVC-%Pred-Post: 99 %
FVC-%Pred-Pre: 99 %
FVC-Post: 2.7 L
FVC-Pre: 2.72 L
Post FEV1/FVC ratio: 74 %
Post FEV6/FVC ratio: 100 %
Pre FEV1/FVC ratio: 74 %
Pre FEV6/FVC Ratio: 100 %
RV % pred: 91 %
RV: 1.9 L
TLC % pred: 88 %
TLC: 4.61 L

## 2021-09-17 MED ORDER — OMEPRAZOLE 40 MG PO CPDR
40.0000 mg | DELAYED_RELEASE_CAPSULE | Freq: Every day | ORAL | 1 refills | Status: DC
  Filled 2021-09-17: qty 90, 90d supply, fill #0
  Filled 2021-10-04 – 2021-12-24 (×2): qty 90, 90d supply, fill #1

## 2021-09-17 NOTE — Progress Notes (Signed)
Full PFT performed today. °

## 2021-09-17 NOTE — Patient Instructions (Signed)
Full PFT performed today. °

## 2021-09-17 NOTE — Progress Notes (Signed)
Cindy Weber    [id number]    [birth year]  Primary Care Physician:Becker, Mancel Bale, PA Date of Appointment: 09/17/2021 Established Patient Visit  Chief complaint:   Chief Complaint  Patient presents with   Follow-up    Pft done today     HPI: Cindy Weber is a 60 y.o. woman with tobacco use disorder and dyspnea with coughing.   Interval Updates: Here for follow up after PFTs. Still having shortness of breath. Notices worse when its raining or windy. Albuterol does help her symptoms but she has only taken 3 times in the last three months. Still Coughing, sometimes with mucus. Has a dry mouth. Worse as the day goes on or if she is talking a lot. Denies post nasal drainage, frequent throat clearing. Cough doesn't wake her up at night.   She does have acid reflux and takes omeprazole every other day. She takes omeprazole 40 mg every other day.  Reflux symptoms improved by this.   Still on the wellbutrin for smoking cessation - down to 3 cigarettes a day!  No fevers chills night sweats or weight loss  I have reviewed the patient's family social and past medical history and updated as appropriate.   Past Medical History:  Diagnosis Date   Breast cancer in female [facility]) 2013   - Lumpectomy & LN resection.  Chemo -- > residual hair loss (eye brows & hair);    Coronary artery disease involving native heart with angina pectoris ([place]) 02/2013   Non-STEMI - 100% CTO RCA (small codominant posterior and.; Normal LAD. The distal circumflex 95% stenosis - PCI with DES 2 (Xience expedition stents overlapping -2.25 mm x 15 mm and 2.5 mm x 23 mm both postdilated 2.5 mm balloon)   DM (diabetes mellitus), type 2 with complications (HCC)    CAD - MI.    Essential hypertension    Hyperlipidemia associated with type 2 diabetes mellitus ([place])    Non-STEMI (non-ST elevated myocardial infarction) ([place]) 2014   PCI - (2 stents) -formerly cared for by Dr. Redmond Pulling [facility].     Past Surgical History:  Procedure Laterality Date   BREAST LUMPECTOMY Left 2014   CARDIAC CATHETERIZATION  02/15/2013   [facility] (Dr. Daiva Huge): Codominant system.  100% CTO of small RCA.  d Cx 95%, normal LAD. -> PCI of dCx   LEFT OOPHORECTOMY  1998   PERCUTANEOUS CORONARY STENT INTERVENTION (PCI-S)  02/16/2013   [facility] -(Dr. Daiva Huge): PCI dCX 95%,- Xience expedition DES 2.25 mm x 15 mm overlap approximately with a Xience exhibition DES 2.5 mm x 23 mm -postdilated to 2.5 mm throughout   TRANSTHORACIC ECHOCARDIOGRAM  02/15/2013   [facility]: Normal LV Size & function. EF ~55-60%.  GR1DD. Mild MR & LAE.     Family History  Problem Relation Age of Onset   Diabetes Mother    Heart disease Mother    Liver disease Father    Healthy Sister    Hypertension Brother    Diabetes Maternal Grandfather    Diabetes Sister    Hypertension Sister    Heart disease Brother    Heart attack Brother    Diabetes Brother    Hypertension Brother    Hypertension Daughter    Hypertension Son    Diabetes Son    Diabetes Son     Social History   Occupational History   Not on file  Tobacco Use   Smoking status: Every Day  Packs/day: 1.00    Years: 40.00    Pack years: 40.00    Types: Cigarettes   Smokeless tobacco: Never   Tobacco comments:    3-5 cigs per day  Substance and Sexual Activity   Alcohol use: Not on file   Drug use: Not on file   Sexual activity: Not on file     Physical Exam: Blood pressure 130/78, pulse 89, height 5\' 5"  (1.651 m), weight 184 lb (83.5 kg), SpO2 97 %.  Gen:      No acute distress ENT:  no nasal polyps, mucus membranes moist Lungs:    No increased respiratory effort, symmetric chest wall excursion, clear to auscultation bilaterally, no wheezes or crackles CV:         Regular rate and rhythm; no murmurs, rubs, or gallops.  No pedal edema   Data Reviewed: Imaging:   PFTs:  PFT Results Latest Ref Rng & Units 09/17/2021  FVC-Pre L 2.72  FVC-Predicted Pre % 99   FVC-Post L 2.70  FVC-Predicted Post % 99  Pre FEV1/FVC % % 74  Post FEV1/FCV % % 74  FEV1-Pre L 2.00  FEV1-Predicted Pre % 93  FEV1-Post L 2.00  DLCO uncorrected ml/min/mmHg 14.72  DLCO UNC% % 70  DLCO corrected ml/min/mmHg 14.72  DLCO COR %Predicted % 70  DLVA Predicted % 85  TLC L 4.61  TLC % Predicted % 88  RV % Predicted % 91   I have personally reviewed the patient's PFTs and normal pulmonary function.   Labs:  Immunization status: Immunization History  Administered Date(s) Administered   Influenza Split 07/13/2018, 06/26/2020   Influenza, Seasonal, Injecte, Preservative Fre 07/19/2016   Influenza,inj,Quad PF,6-35 Mos 07/16/2021   PFIZER Comirnaty(Gray Top)Covid-19 Tri-Sucrose Vaccine 03/20/2021   PFIZER(Purple Top)SARS-COV-2 Vaccination 10/03/2019, 10/22/2019, 07/21/2020   Pneumococcal Polysaccharide-23 09/17/2018   Td 08/11/1989   Tdap 01/15/2021      Assessment:  Chronic Cough Tobacco Use Disorder, actively cutting back on smoking CAD s/p PCI 2014 Breast cancer s/p chemo/rad/lumpectomy GERD on PPI  Plan/Recommendations: Suspect her cough is related to uncontrolled reflux. Discussed medication and lifestyle modification for this. Increase PPI to daily instead of every other day.  Explained that although PFTs are normal, she could still have asthma. If dyspnea is troublesome she can take albuterol more often or consider trial of ICS-LABA Continue albuterol as needed.  Still on the wellbutrin for smoking cessation - down to 3 cigarettes a day! Continue this and set a quit date.    Return to Care: No follow-ups on file.   Lenice Llamas, MD Pulmonary and [place]

## 2021-09-17 NOTE — Patient Instructions (Addendum)
Please schedule follow up scheduled with myself in 6 months.  If my schedule is not open yet, we will contact you with a reminder closer to that time. Please call [phone] if you haven't heard from [place] a month before.   Follow up with me sooner if your shortness of breath is interfering with your daily life and you want to try a rescue inhaler.   Great job cutting back on smoking! Keep up the good work.   Increase acid reflux medicine to once a day 40 mg.   What is GERD? Gastroesophageal reflux disease (GERD) is gastroesophageal reflux diseasewhich occurs when the lower esophageal sphincter (LES) opens spontaneously, for varying periods of time, or does not close properly and stomach contents rise up into the esophagus. GER is also called acid reflux or acid regurgitation, because digestive juices--called acids--rise up with the food. The esophagus is the tube that carries food from the mouth to the stomach. The LES is a ring of muscle at the bottom of the esophagus that acts like a valve between the esophagus and stomach.  When acid reflux occurs, food or fluid can be tasted in the back of the mouth. When refluxed stomach acid touches the lining of the esophagus it may cause a burning sensation in the chest or throat called heartburn or acid indigestion. Occasional reflux is common. Persistent reflux that occurs more than twice a week is considered GERD, and it can eventually lead to more serious health problems. People of all ages can have GERD. Studies have shown that GERD may worsen or contribute to asthma, chronic cough, and pulmonary fibrosis.   What are the symptoms of GERD? The main symptom of GERD in adults is frequent heartburn, also called acid indigestion--burning-type pain in the lower part of the mid-chest, behind the breast bone, and in the mid-abdomen.  Not all reflux is acidic in nature, and many patients don't have heart burn at all. Sometimes it feels like a cough (either dry  or with mucus), choking sensation, asthma, shortness of breath, waking up at night, frequent throat clearing, or trouble swallowing.    What causes GERD? The reason some people develop GERD is still unclear. However, research shows that in people with GERD, the LES relaxes while the rest of the esophagus is working. Anatomical abnormalities such as a hiatal hernia may also contribute to GERD. A hiatal hernia occurs when the upper part of the stomach and the LES move above the diaphragm, the muscle wall that separates the stomach from the chest. Normally, the diaphragm helps the LES keep acid from rising up into the esophagus. When a hiatal hernia is present, acid reflux can occur more easily. A hiatal hernia can occur in people of any age and is most often a normal finding in otherwise healthy people over age 57. Most of the time, a hiatal hernia produces no symptoms.   Other factors that may contribute to GERD include - Obesity or recent weight gain - Pregnancy  - Smoking  - Diet - Certain medications  Common foods that can worsen reflux symptoms include: - carbonated beverages - artificial sweeteners - citrus fruits  - chocolate  - drinks with caffeine or alcohol  - fatty and fried foods  - garlic and onions  - mint flavorings  - spicy foods  - tomato-based foods, like spaghetti sauce, salsa, chili, and pizza   Lifestyle Changes If you smoke, stop.  Avoid foods and beverages that worsen symptoms (see above.) Lose  weight if needed.  Eat small, frequent meals.  Wear loose-fitting clothes.  Avoid lying down for 3 hours after a meal.  Raise the head of your bed 6 to 8 inches by securing wood blocks under the bedposts. Just using extra pillows will not help, but using a wedge-shaped pillow may be helpful.  Medications  H2 blockers, such as cimetidine (Tagamet HB), famotidine (Pepcid AC), nizatidine (Axid AR), and ranitidine (Zantac 75), decrease acid production. They are available in  prescription strength and over-the-counter strength. These drugs provide short-term relief and are effective for about half of those who have GERD symptoms.  Proton pump inhibitors include omeprazole (Prilosec, Zegerid), lansoprazole (Prevacid), pantoprazole (Protonix), rabeprazole (Aciphex), and esomeprazole (Nexium), which are available by prescription. Prilosec is also available in over-the-counter strength. Proton pump inhibitors are more effective than H2 blockers and can relieve symptoms and heal the esophageal lining in almost everyone who has GERD.  Because drugs work in different ways, combinations of medications may help control symptoms. People who get heartburn after eating may take both antacids and H2 blockers. The antacids work first to neutralize the acid in the stomach, and then the H2 blockers act on acid production. By the time the antacid stops working, the H2 blocker will have stopped acid production. Your health care provider is the best source of information about how to use medications for GERD.   Points to Remember 1. You can have GERD without having heartburn. Your symptoms could include a dry cough, asthma symptoms, or trouble swallowing.  2. Taking medications daily as prescribed is important in controlling you symptoms.  Sometimes it can take up to 8 weeks to fully achieve the effects of the medications prescribed.  3. Coughing related to GERD can be difficult to treat and is very frustrating!  However, it is important to stick with these medications and lifestyle modifications before pursuing more aggressive or invasive test and treatments.

## 2021-10-04 ENCOUNTER — Other Ambulatory Visit (HOSPITAL_COMMUNITY): Payer: Self-pay

## 2021-10-04 MED FILL — Atorvastatin Calcium Tab 40 MG (Base Equivalent): ORAL | 90 days supply | Qty: 45 | Fill #1 | Status: AC

## 2021-10-17 ENCOUNTER — Other Ambulatory Visit: Payer: Self-pay

## 2021-10-17 ENCOUNTER — Other Ambulatory Visit (HOSPITAL_COMMUNITY): Payer: Self-pay

## 2021-10-17 ENCOUNTER — Ambulatory Visit: Payer: 59 | Admitting: Podiatry

## 2021-10-17 ENCOUNTER — Ambulatory Visit (INDEPENDENT_AMBULATORY_CARE_PROVIDER_SITE_OTHER): Payer: 59

## 2021-10-17 VITALS — BP 134/81 | HR 90 | Temp 98.2°F

## 2021-10-17 DIAGNOSIS — M21619 Bunion of unspecified foot: Secondary | ICD-10-CM

## 2021-10-17 DIAGNOSIS — M722 Plantar fascial fibromatosis: Secondary | ICD-10-CM

## 2021-10-17 MED ORDER — DICLOFENAC SODIUM 1 % EX GEL
2.0000 g | Freq: Four times a day (QID) | CUTANEOUS | 2 refills | Status: DC
  Filled 2021-10-17: qty 100, 13d supply, fill #0
  Filled 2022-05-20: qty 100, 13d supply, fill #1

## 2021-10-17 NOTE — Progress Notes (Signed)
Subjective:   Patient ID: Cindy Weber, female   DOB: 61 y.o.   MRN: [id number]   HPI 61 year old female presents the office today for concerns of a painful bunion on her right foot that started getting worse about a month ago.  Is been ongoing and present but seems to be worsening.  No recent injury or treatment.  She does get some numbness directly on the bunion area but no radiating pain.  Also concerned possible small bump on the bottom of her foot as well.  Not cause much discomfort.  No recent treatment.  No injury or stepping any foreign objects.  She is diabetic and last A1c was 6.7. She smokes 1/3 pack/day she is working on decreasing this   Review of Systems  All other systems reviewed and are negative.  Past Medical History:  Diagnosis Date   Breast cancer in female [facility]) 2013   - Lumpectomy & LN resection.  Chemo -- > residual hair loss (eye brows & hair);    Coronary artery disease involving native heart with angina pectoris ([place]) 02/2013   Non-STEMI - 100% CTO RCA (small codominant posterior and.; Normal LAD. The distal circumflex 95% stenosis - PCI with DES 2 (Xience expedition stents overlapping -2.25 mm x 15 mm and 2.5 mm x 23 mm both postdilated 2.5 mm balloon)   DM (diabetes mellitus), type 2 with complications (HCC)    CAD - MI.    Essential hypertension    Hyperlipidemia associated with type 2 diabetes mellitus ([place])    Non-STEMI (non-ST elevated myocardial infarction) ([place]) 2014   PCI - (2 stents) -formerly cared for by Dr. Redmond Pulling [facility].    Past Surgical History:  Procedure Laterality Date   BREAST LUMPECTOMY Left 2014   CARDIAC CATHETERIZATION  02/15/2013   [facility] (Dr. Daiva Huge): Codominant system.  100% CTO of small RCA.  d Cx 95%, normal LAD. -> PCI of dCx   LEFT OOPHORECTOMY  1998   PERCUTANEOUS CORONARY STENT INTERVENTION (PCI-S)  02/16/2013   [facility] -(Dr. Daiva Huge): PCI dCX 95%,- Xience expedition DES 2.25 mm x 15 mm overlap approximately with a  Xience exhibition DES 2.5 mm x 23 mm -postdilated to 2.5 mm throughout   TRANSTHORACIC ECHOCARDIOGRAM  02/15/2013   [facility]: Normal LV Size & function. EF ~55-60%.  GR1DD. Mild MR & LAE.      Current Outpatient Medications:    diclofenac Sodium (VOLTAREN) 1 % GEL, Apply 2 grams topically to affected area of foot 2- 4 times daily as directe., Disp: 100 g, Rfl: 2   albuterol (VENTOLIN HFA) 108 (90 Base) MCG/ACT inhaler, Inhale 2 puffs into the lungs every 6 (six) hours as needed., Disp: 18 g, Rfl: 5   amLODipine (NORVASC) 5 MG tablet, Take 1 tablet (5 mg total) by mouth daily., Disp: 90 tablet, Rfl: 3   atorvastatin (LIPITOR) 40 MG tablet, TAKE 1/2 TABLET BY MOUTH ONCE A DAY, Disp: 45 tablet, Rfl: 1   Blood Glucose Monitoring Suppl (FREESTYLE LITE) w/Device KIT, Use to check blood sugar 2 times a day, Disp: 1 kit, Rfl: 0   buPROPion (WELLBUTRIN SR) 150 MG 12 hr tablet, Take 1 tablet (150 mg total) by mouth 2 (two) times daily., Disp: 60 tablet, Rfl: 5   carvedilol (COREG) 25 MG tablet, Take 1 tablet by mouth twice a day, Disp: 180 tablet, Rfl: 1   cetirizine (ZYRTEC) 10 MG tablet, 1 tablet, Disp: , Rfl:    empagliflozin (JARDIANCE) 25 MG TABS tablet,  Take 1 tablet by mouth once a day, Disp: 90 tablet, Rfl: 1   fluconazole (DIFLUCAN) 150 MG tablet, Take 1 tablet by mouth once now and once in 48 hours, Disp: 2 tablet, Rfl: 3   fluconazole (DIFLUCAN) 150 MG tablet, Take 1 tablet by mouth once a day as needed for yeast infection, Disp: 4 tablet, Rfl: 2   glucose blood test strip, Use to check blood sugar 2 times a day, Disp: 300 each, Rfl: 3   Ibuprofen-Famotidine (DUEXIS) 800-26.6 MG TABS, Take 1 tablet by mouth 2 (two) times daily as needed., Disp: 90 tablet, Rfl: 0   Lancets (FREESTYLE) lancets, Use to check blood sugar 2 times a day, Disp: 300 each, Rfl: 3   letrozole (FEMARA) 2.5 MG tablet, Take 1 tablet by mouth daily., Disp: , Rfl: 11   metFORMIN (GLUCOPHAGE) 500 MG tablet, Take 1 tablet by  mouth 2 (two) times daily., Disp: , Rfl:    metFORMIN (GLUCOPHAGE) 500 MG tablet, Take 1 tablet by mouth twice a day with a meal ., Disp: 180 tablet, Rfl: 1   omeprazole (PRILOSEC) 40 MG capsule, Take 1 capsule by mouth daily., Disp: 90 capsule, Rfl: 1   Semaglutide (RYBELSUS) 3 MG TABS, Take 1 tablet by mouth once daily at least 30 minutes before first food, beverage or other oral medicine of the day., Disp: 30 tablet, Rfl: 0   Semaglutide (RYBELSUS) 7 MG TABS, Take 1 tablet by mouth daily at least 30 minutes before first food, beverage or other oral medicine of the day. (Patient not taking: Reported on 09/17/2021), Disp: 30 tablet, Rfl: 4   ticagrelor (BRILINTA) 60 MG TABS tablet, TAKE 1 TABLET BY MOUTH TWO TIMES DAILY (NEEDS ANNUAL VISIT), Disp: 60 tablet, Rfl: 11   valACYclovir (VALTREX) 1000 MG tablet, Take 1 tablet (1,000 mg total) by mouth 3 (three) times daily., Disp: 20 tablet, Rfl: 0   vitamin B-12 (CYANOCOBALAMIN) 500 MCG tablet, Take 1 tablet by mouth daily., Disp: , Rfl:   Allergies  Allergen Reactions   Lisinopril     Other reaction(s): cough   Losartan Potassium     Other reaction(s): cuogh   Valsartan Cough   Other Rash    Pt reports that hair and skin dye burned her skin.          Objective:  Physical Exam  General: AAO x3, NAD  Dermatological: Skin is warm, dry and supple bilateral.  There are no open sores, no preulcerative lesions, no rash or signs of infection present.  Vascular: Dorsalis Pedis artery and Posterior Tibial artery pedal pulses are 2/4 bilateral with immedate capillary fill time. There is no pain with calf compression, swelling, warmth, erythema.   Neruologic: Grossly intact via light touch bilateral.   Musculoskeletal: Moderate bunion present on the left foot.  Tender structure along the medial first metatarsal head but no other areas of discomfort.  No areas of pinpoint tenderness.  No pain or crepitation first MPJ range of motion.  No  hypermobility of the first ray.  Small plantar fibroma present on the arch of the foot with slight discomfort at times but no pain on exam today.  No edema no erythema.  Muscular strength 5/5 in all groups tested bilateral.  Gait: Unassisted, Nonantalgic.       Assessment:   Symptomatic bunion, small plantar fibroma     Plan:  -Treatment options discussed including all alternatives, risks, and complications -Etiology of symptoms were discussed -X-rays were obtained and reviewed with  the patient.  Moderate bunion is present.  There is no evidence of acute fracture. -Regards to the plan we discussed with conservative as well as surgical treatment options.  She was to have any surgical intervention.  Discussed shoe modifications, arch supports as well as shoes to avoid any pressure.  Discussed offloading pads.  Voltaren gel or topical medications to help. -For the plantar fibroid discussed traction exercises as well as Voltaren gel topically.  Trula Slade DPM

## 2021-10-17 NOTE — Patient Instructions (Signed)
Bunion A bunion (hallux valgus) is a bump that forms slowly on the inner side of the big toe joint. It occurs when the big toe turns toward the second toe. Bunions may be small at first, but they often get larger over time. They can make walking painful. What are the causes? This condition may be caused by: Wearing narrow or pointed shoes that force the big toe to press against the other toes. Abnormal foot development that causes the foot to roll inward. Changes in the foot that are caused by certain diseases, such as rheumatoid arthritis or polio. A foot injury. What increases the risk? The following factors may make you more likely to develop this condition: Wearing shoes that squeeze the toes together. Having certain diseases, such as: Rheumatoid arthritis. Polio. Cerebral palsy. Having family members who have bunions. Being born with abnormally shaped feet (a foot deformity), such as flat feet or low arches. Doing activities that put a lot of pressure on the feet, such as ballet dancing. What are the signs or symptoms? The main symptom of this condition is a bump on your big toe that you can notice. Other symptoms may include: Pain. Redness and inflammation around your big toe. Thick or hardened skin on your big toe or between your toes. Stiffness or loss of motion in your big toe. Trouble with walking. How is this diagnosed? This condition may be diagnosed based on your symptoms, medical history, and activities. You may also have tests and imaging, such as: X-rays. These allow your health care provider to check the position of the bones in your foot and look for damage to your joint. They also help your health care provider determine the severity of your bunion and the best way to treat it. Joint aspiration. In this test, a sample of fluid is removed from the toe joint. This test may be done if you are in a lot of pain. It helps rule out diseases that cause painful swelling of the  joints, such as arthritis or gout. How is this treated? Treatment depends on the severity of your symptoms. The goal of treatment is to relieve symptoms and prevent your bunion from getting worse. Your health care provider may recommend: Wearing shoes that have a wide toe box, or using bunion pads to cushion the affected area. Taping your toes together to keep them in a normal position. Placing a device inside your shoe (orthotic device) to help reduce pressure on your toe joint. Taking medicine to ease pain and inflammation. Putting ice or heat on the affected area. Doing stretching exercises. Surgery, for severe cases. Follow these instructions at home: Managing pain, stiffness, and swelling   If directed, put ice on the painful area. To do this: Put ice in a plastic bag. Place a towel between your skin and the bag. Leave the ice on for 20 minutes, 2-3 times a day. Remove the ice if your skin turns bright red. This is very important. If you cannot feel pain, heat, or cold, you have a greater risk of damage to the area. If directed, apply heat to the affected area before you exercise. Use the heat source that your health care provider recommends, such as a moist heat pack or a heating pad. Place a towel between your skin and the heat source. Leave the heat on for 20-30 minutes. Remove the heat if your skin turns bright red. This is especially important if you are unable to feel pain, heat, or cold. You  have a greater risk of getting burned. General instructions Do exercises as told by your health care provider. Support your toe joint with proper footwear, shoe padding, or taping as told by your health care provider. Take over-the-counter and prescription medicines only as told by your health care provider. Do not use any products that contain nicotine or tobacco, such as cigarettes, e-cigarettes, and chewing tobacco. If you need help quitting, ask your health care provider. Keep all  follow-up visits. This is important. Contact a health care provider if: Your symptoms get worse. Your symptoms do not improve in 2 weeks. Get help right away if: You have severe pain and trouble with walking. Summary A bunion is a bump on the inner side of the big toe joint that forms when the big toe turns toward the second toe. Bunions can make walking painful. Treatment depends on the severity of your symptoms. Support your toe joint with proper footwear, shoe padding, or taping as told by your health care provider. This information is not intended to replace advice given to you by your health care provider. Make sure you discuss any questions you have with your health care provider. Document Revised: 02/03/2020 Document Reviewed: 02/03/2020 [employer] Patient Education  2022 [employer].   Plantar Fasciitis (Heel Spur Syndrome) with Rehab The plantar fascia is a fibrous, ligament-like, soft-tissue structure that spans the bottom of the foot. Plantar fasciitis is a condition that causes pain in the foot due to inflammation of the tissue. SYMPTOMS  Pain and tenderness on the underneath side of the foot. Pain that worsens with standing or walking. CAUSES  Plantar fasciitis is caused by irritation and injury to the plantar fascia on the underneath side of the foot. Common mechanisms of injury include: Direct trauma to bottom of the foot. Damage to a small nerve that runs under the foot where the main fascia attaches to the heel bone. Stress placed on the plantar fascia due to bone spurs. RISK INCREASES WITH:  Activities that place stress on the plantar fascia (running, jumping, pivoting, or cutting). Poor strength and flexibility. Improperly fitted shoes. Tight calf muscles. Flat feet. Failure to warm-up properly before activity. Obesity. PREVENTION Warm up and stretch properly before activity. Allow for adequate recovery between workouts. Maintain physical fitness: Strength,  flexibility, and endurance. Cardiovascular fitness. Maintain a health body weight. Avoid stress on the plantar fascia. Wear properly fitted shoes, including arch supports for individuals who have flat feet.  PROGNOSIS  If treated properly, then the symptoms of plantar fasciitis usually resolve without surgery. However, occasionally surgery is necessary.  RELATED COMPLICATIONS  Recurrent symptoms that may result in a chronic condition. Problems of the lower back that are caused by compensating for the injury, such as limping. Pain or weakness of the foot during push-off following surgery. Chronic inflammation, scarring, and partial or complete fascia tear, occurring more often from repeated injections.  TREATMENT  Treatment initially involves the use of ice and medication to help reduce pain and inflammation. The use of strengthening and stretching exercises may help reduce pain with activity, especially stretches of the Achilles tendon. These exercises may be performed at home or with a therapist. Your caregiver may recommend that you use heel cups of arch supports to help reduce stress on the plantar fascia. Occasionally, corticosteroid injections are given to reduce inflammation. If symptoms persist for greater than 6 months despite non-surgical (conservative), then surgery may be recommended.   MEDICATION  If pain medication is necessary, then nonsteroidal anti-inflammatory  medications, such as aspirin and ibuprofen, or other minor pain relievers, such as acetaminophen, are often recommended. Do not take pain medication within 7 days before surgery. Prescription pain relievers may be given if deemed necessary by your caregiver. Use only as directed and only as much as you need. Corticosteroid injections may be given by your caregiver. These injections should be reserved for the most serious cases, because they may only be given a certain number of times.  HEAT AND COLD Cold treatment  (icing) relieves pain and reduces inflammation. Cold treatment should be applied for 10 to 15 minutes every 2 to 3 hours for inflammation and pain and immediately after any activity that aggravates your symptoms. Use ice packs or massage the area with a piece of ice (ice massage). Heat treatment may be used prior to performing the stretching and strengthening activities prescribed by your caregiver, physical therapist, or athletic trainer. Use a heat pack or soak the injury in warm water.  SEEK IMMEDIATE MEDICAL CARE IF: Treatment seems to offer no benefit, or the condition worsens. Any medications produce adverse side effects.  EXERCISES- RANGE OF MOTION (ROM) AND STRETCHING EXERCISES - Plantar Fasciitis (Heel Spur Syndrome) These exercises may help you when beginning to rehabilitate your injury. Your symptoms may resolve with or without further involvement from your physician, physical therapist or athletic trainer. While completing these exercises, remember:  Restoring tissue flexibility helps normal motion to return to the joints. This allows healthier, less painful movement and activity. An effective stretch should be held for at least 30 seconds. A stretch should never be painful. You should only feel a gentle lengthening or release in the stretched tissue.  RANGE OF MOTION - Toe Extension, Flexion Sit with your right / left leg crossed over your opposite knee. Grasp your toes and gently pull them back toward the top of your foot. You should feel a stretch on the bottom of your toes and/or foot. Hold this stretch for 10 seconds. Now, gently pull your toes toward the bottom of your foot. You should feel a stretch on the top of your toes and or foot. Hold this stretch for 10 seconds. Repeat  times. Complete this stretch 3 times per day.   RANGE OF MOTION - Ankle Dorsiflexion, Active Assisted Remove shoes and sit on a chair that is preferably not on a carpeted surface. Place right / left  foot under knee. Extend your opposite leg for support. Keeping your heel down, slide your right / left foot back toward the chair until you feel a stretch at your ankle or calf. If you do not feel a stretch, slide your bottom forward to the edge of the chair, while still keeping your heel down. Hold this stretch for 10 seconds. Repeat 3 times. Complete this stretch 2 times per day.   STRETCH  Gastroc, Standing Place hands on wall. Extend right / left leg, keeping the front knee somewhat bent. Slightly point your toes inward on your back foot. Keeping your right / left heel on the floor and your knee straight, shift your weight toward the wall, not allowing your back to arch. You should feel a gentle stretch in the right / left calf. Hold this position for 10 seconds. Repeat 3 times. Complete this stretch 2 times per day.  STRETCH  Soleus, Standing Place hands on wall. Extend right / left leg, keeping the other knee somewhat bent. Slightly point your toes inward on your back foot. Keep your right / left  heel on the floor, bend your back knee, and slightly shift your weight over the back leg so that you feel a gentle stretch deep in your back calf. Hold this position for 10 seconds. Repeat 3 times. Complete this stretch 2 times per day.  STRETCH  Gastrocsoleus, Standing  Note: This exercise can place a lot of stress on your foot and ankle. Please complete this exercise only if specifically instructed by your caregiver.  Place the ball of your right / left foot on a step, keeping your other foot firmly on the same step. Hold on to the wall or a rail for balance. Slowly lift your other foot, allowing your body weight to press your heel down over the edge of the step. You should feel a stretch in your right / left calf. Hold this position for 10 seconds. Repeat this exercise with a slight bend in your right / left knee. Repeat 3 times. Complete this stretch 2 times per day.   STRENGTHENING  EXERCISES - Plantar Fasciitis (Heel Spur Syndrome)  These exercises may help you when beginning to rehabilitate your injury. They may resolve your symptoms with or without further involvement from your physician, physical therapist or athletic trainer. While completing these exercises, remember:  Muscles can gain both the endurance and the strength needed for everyday activities through controlled exercises. Complete these exercises as instructed by your physician, physical therapist or athletic trainer. Progress the resistance and repetitions only as guided.  STRENGTH - Towel Curls Sit in a chair positioned on a non-carpeted surface. Place your foot on a towel, keeping your heel on the floor. Pull the towel toward your heel by only curling your toes. Keep your heel on the floor. Repeat 3 times. Complete this exercise 2 times per day.  STRENGTH - Ankle Inversion Secure one end of a rubber exercise band/tubing to a fixed object (table, pole). Loop the other end around your foot just before your toes. Place your fists between your knees. This will focus your strengthening at your ankle. Slowly, pull your big toe up and in, making sure the band/tubing is positioned to resist the entire motion. Hold this position for 10 seconds. Have your muscles resist the band/tubing as it slowly pulls your foot back to the starting position. Repeat 3 times. Complete this exercises 2 times per day.  Document Released: 09/29/2005 Document Revised: 12/22/2011 Document Reviewed: 01/11/2009 [facility] Patient Information 2014 [place], [place].

## 2021-10-28 ENCOUNTER — Other Ambulatory Visit (HOSPITAL_COMMUNITY): Payer: Self-pay

## 2021-10-31 ENCOUNTER — Other Ambulatory Visit: Payer: Self-pay | Admitting: Family Medicine

## 2021-10-31 DIAGNOSIS — Z1231 Encounter for screening mammogram for malignant neoplasm of breast: Secondary | ICD-10-CM

## 2021-11-04 ENCOUNTER — Other Ambulatory Visit (HOSPITAL_COMMUNITY): Payer: Self-pay

## 2021-11-12 ENCOUNTER — Other Ambulatory Visit (HOSPITAL_COMMUNITY): Payer: Self-pay

## 2021-11-12 ENCOUNTER — Other Ambulatory Visit: Payer: Self-pay

## 2021-11-12 ENCOUNTER — Ambulatory Visit
Admission: RE | Admit: 2021-11-12 | Discharge: 2021-11-12 | Disposition: A | Discharge status: Home / Self Care | Payer: 59 | Source: Ambulatory Visit | Attending: Family Medicine | Admitting: Family Medicine

## 2021-11-12 ENCOUNTER — Other Ambulatory Visit: Payer: Self-pay | Admitting: Podiatry

## 2021-11-12 DIAGNOSIS — Z1231 Encounter for screening mammogram for malignant neoplasm of breast: Secondary | ICD-10-CM

## 2021-11-12 DIAGNOSIS — I2510 Atherosclerotic heart disease of native coronary artery without angina pectoris: Secondary | ICD-10-CM | POA: Diagnosis not present

## 2021-11-12 DIAGNOSIS — I25119 Atherosclerotic heart disease of native coronary artery with unspecified angina pectoris: Secondary | ICD-10-CM | POA: Diagnosis not present

## 2021-11-12 DIAGNOSIS — F17200 Nicotine dependence, unspecified, uncomplicated: Secondary | ICD-10-CM | POA: Diagnosis not present

## 2021-11-12 DIAGNOSIS — K219 Gastro-esophageal reflux disease without esophagitis: Secondary | ICD-10-CM | POA: Diagnosis not present

## 2021-11-12 DIAGNOSIS — E7800 Pure hypercholesterolemia, unspecified: Secondary | ICD-10-CM | POA: Diagnosis not present

## 2021-11-12 DIAGNOSIS — E669 Obesity, unspecified: Secondary | ICD-10-CM | POA: Diagnosis not present

## 2021-11-12 DIAGNOSIS — Z853 Personal history of malignant neoplasm of breast: Secondary | ICD-10-CM | POA: Diagnosis not present

## 2021-11-12 DIAGNOSIS — E1169 Type 2 diabetes mellitus with other specified complication: Secondary | ICD-10-CM | POA: Diagnosis not present

## 2021-11-12 DIAGNOSIS — I10 Essential (primary) hypertension: Secondary | ICD-10-CM | POA: Diagnosis not present

## 2021-11-12 DIAGNOSIS — M722 Plantar fascial fibromatosis: Secondary | ICD-10-CM

## 2021-11-12 MED ORDER — CLOTRIMAZOLE 1 % EX CREA
TOPICAL_CREAM | CUTANEOUS | 0 refills | Status: AC
  Filled 2021-11-12 – 2022-05-22 (×2): qty 30, 7d supply, fill #0

## 2021-11-25 ENCOUNTER — Other Ambulatory Visit (HOSPITAL_COMMUNITY): Payer: Self-pay

## 2021-12-01 ENCOUNTER — Other Ambulatory Visit (HOSPITAL_COMMUNITY): Payer: Self-pay

## 2021-12-02 ENCOUNTER — Other Ambulatory Visit (HOSPITAL_COMMUNITY): Payer: Self-pay

## 2021-12-02 MED ORDER — RYBELSUS 7 MG PO TABS
ORAL_TABLET | ORAL | 2 refills | Status: DC
  Filled 2021-12-02: qty 30, 30d supply, fill #0
  Filled 2022-01-05: qty 30, 30d supply, fill #1
  Filled 2022-02-11: qty 30, 30d supply, fill #2

## 2021-12-09 ENCOUNTER — Other Ambulatory Visit (HOSPITAL_COMMUNITY): Payer: Self-pay

## 2021-12-15 ENCOUNTER — Other Ambulatory Visit (HOSPITAL_COMMUNITY): Payer: Self-pay

## 2021-12-16 ENCOUNTER — Other Ambulatory Visit (HOSPITAL_COMMUNITY): Payer: Self-pay

## 2021-12-24 ENCOUNTER — Other Ambulatory Visit (HOSPITAL_COMMUNITY): Payer: Self-pay

## 2022-01-06 ENCOUNTER — Other Ambulatory Visit (HOSPITAL_COMMUNITY): Payer: Self-pay

## 2022-01-14 ENCOUNTER — Other Ambulatory Visit (HOSPITAL_COMMUNITY): Payer: Self-pay

## 2022-01-14 DIAGNOSIS — H01004 Unspecified blepharitis left upper eyelid: Secondary | ICD-10-CM | POA: Diagnosis not present

## 2022-01-14 MED ORDER — OFLOXACIN 0.3 % OP SOLN
OPHTHALMIC | 0 refills | Status: AC
  Filled 2022-01-14: qty 5, 7d supply, fill #0

## 2022-01-14 MED ORDER — ATORVASTATIN CALCIUM 40 MG PO TABS
ORAL_TABLET | ORAL | 3 refills | Status: DC
  Filled 2022-01-14: qty 45, 90d supply, fill #0

## 2022-01-17 ENCOUNTER — Other Ambulatory Visit (HOSPITAL_COMMUNITY): Payer: Self-pay

## 2022-01-24 ENCOUNTER — Other Ambulatory Visit (HOSPITAL_COMMUNITY): Payer: Self-pay

## 2022-01-24 DIAGNOSIS — Z0000 Encounter for general adult medical examination without abnormal findings: Secondary | ICD-10-CM | POA: Diagnosis not present

## 2022-01-24 DIAGNOSIS — E1169 Type 2 diabetes mellitus with other specified complication: Secondary | ICD-10-CM | POA: Diagnosis not present

## 2022-01-24 DIAGNOSIS — I10 Essential (primary) hypertension: Secondary | ICD-10-CM | POA: Diagnosis not present

## 2022-01-24 DIAGNOSIS — E7800 Pure hypercholesterolemia, unspecified: Secondary | ICD-10-CM | POA: Diagnosis not present

## 2022-01-24 DIAGNOSIS — E669 Obesity, unspecified: Secondary | ICD-10-CM | POA: Diagnosis not present

## 2022-01-24 DIAGNOSIS — I2510 Atherosclerotic heart disease of native coronary artery without angina pectoris: Secondary | ICD-10-CM | POA: Diagnosis not present

## 2022-01-24 DIAGNOSIS — K219 Gastro-esophageal reflux disease without esophagitis: Secondary | ICD-10-CM | POA: Diagnosis not present

## 2022-01-24 DIAGNOSIS — F17200 Nicotine dependence, unspecified, uncomplicated: Secondary | ICD-10-CM | POA: Diagnosis not present

## 2022-01-24 DIAGNOSIS — Z853 Personal history of malignant neoplasm of breast: Secondary | ICD-10-CM | POA: Diagnosis not present

## 2022-01-24 MED ORDER — FREESTYLE LANCETS MISC
1 refills | Status: AC
  Filled 2022-01-24: qty 100, 90d supply, fill #0
  Filled 2022-04-18: qty 100, 90d supply, fill #1

## 2022-01-24 MED ORDER — AMLODIPINE BESYLATE 5 MG PO TABS
ORAL_TABLET | ORAL | 1 refills | Status: DC
  Filled 2022-01-24: qty 90, 90d supply, fill #0
  Filled 2022-04-18: qty 90, 90d supply, fill #1

## 2022-01-24 MED ORDER — BUPROPION HCL ER (XL) 150 MG PO TB24
ORAL_TABLET | ORAL | 1 refills | Status: DC
  Filled 2022-01-24: qty 180, 90d supply, fill #0
  Filled 2022-04-18: qty 180, 90d supply, fill #1

## 2022-01-24 MED ORDER — ATORVASTATIN CALCIUM 40 MG PO TABS
ORAL_TABLET | ORAL | 3 refills | Status: DC
  Filled 2022-01-24 – 2022-04-18 (×2): qty 45, 90d supply, fill #0
  Filled 2022-08-03: qty 45, 90d supply, fill #1
  Filled 2022-11-19: qty 45, 90d supply, fill #2

## 2022-01-24 MED ORDER — OMEPRAZOLE 40 MG PO CPDR
DELAYED_RELEASE_CAPSULE | ORAL | 1 refills | Status: DC
  Filled 2022-04-07: qty 90, 90d supply, fill #0
  Filled 2022-04-18 – 2022-07-09 (×2): qty 90, 90d supply, fill #1

## 2022-01-24 MED ORDER — CARVEDILOL 25 MG PO TABS
ORAL_TABLET | ORAL | 1 refills | Status: DC
  Filled 2022-03-16: qty 180, 90d supply, fill #0
  Filled 2022-06-24: qty 180, 90d supply, fill #1

## 2022-01-24 MED ORDER — FREESTYLE LITE W/DEVICE KIT
PACK | 0 refills | Status: AC
  Filled 2022-01-24: qty 1, 1d supply, fill #0

## 2022-01-24 MED ORDER — RYBELSUS 7 MG PO TABS: ORAL_TABLET | ORAL | 3 refills | Status: DC

## 2022-01-24 MED ORDER — JARDIANCE 25 MG PO TABS
ORAL_TABLET | ORAL | 1 refills | Status: DC
  Filled 2022-01-24: qty 90, 90d supply, fill #0
  Filled 2022-04-18: qty 90, 90d supply, fill #1

## 2022-01-24 MED ORDER — METFORMIN HCL 500 MG PO TABS
ORAL_TABLET | ORAL | 1 refills | Status: DC
  Filled 2022-02-26: qty 180, 90d supply, fill #0
  Filled 2022-05-22: qty 180, 90d supply, fill #1

## 2022-01-24 MED ORDER — FREESTYLE LITE TEST VI STRP
ORAL_STRIP | 1 refills | Status: AC
  Filled 2022-01-24: qty 100, 90d supply, fill #0
  Filled 2022-04-18: qty 100, 90d supply, fill #1

## 2022-01-27 ENCOUNTER — Other Ambulatory Visit (HOSPITAL_COMMUNITY): Payer: Self-pay

## 2022-02-11 ENCOUNTER — Other Ambulatory Visit (HOSPITAL_COMMUNITY): Payer: Self-pay

## 2022-02-17 ENCOUNTER — Encounter: Payer: Self-pay | Admitting: Cardiology

## 2022-02-17 ENCOUNTER — Ambulatory Visit (INDEPENDENT_AMBULATORY_CARE_PROVIDER_SITE_OTHER): Payer: 59 | Admitting: Cardiology

## 2022-02-17 VITALS — BP 110/70 | HR 85 | Ht 65.0 in | Wt 180.6 lb

## 2022-02-17 DIAGNOSIS — E1169 Type 2 diabetes mellitus with other specified complication: Secondary | ICD-10-CM | POA: Diagnosis not present

## 2022-02-17 DIAGNOSIS — E785 Hyperlipidemia, unspecified: Secondary | ICD-10-CM

## 2022-02-17 DIAGNOSIS — I2510 Atherosclerotic heart disease of native coronary artery without angina pectoris: Secondary | ICD-10-CM

## 2022-02-17 DIAGNOSIS — I252 Old myocardial infarction: Secondary | ICD-10-CM | POA: Diagnosis not present

## 2022-02-17 DIAGNOSIS — I10 Essential (primary) hypertension: Secondary | ICD-10-CM

## 2022-02-17 NOTE — Progress Notes (Signed)
Primary Care Provider: Lois Huxley, PA Cardiologist: Glenetta Hew, MD Electrophysiologist: None  Clinic Note: Chief Complaint  Patient presents with   Follow-up    Annual.   Coronary Artery Disease    No angina   ===================================  ASSESSMENT/PLAN   Problem List Items Addressed This Visit       Cardiology Problems   Coronary artery disease involving native heart without angina pectoris - Primary (Chronic)    No real anginal symptoms since her MI back in 2014.  Some twinging sensations off and on, but nothing significant.  She has exertional dyspnea and fatigue more related to deconditioning.  Continue to encourage her to exercise.  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  But hard to get out and go for a walk for least 15 minutes twice a day.  Plan: Continue carvedilol, amlodipine and atorvastatin at current dose. Converted to calcium channel blocker from ARB because of cough On Jardiance and Rybelsus On maintenance dose Brilinta 60 mg twice daily Okay to hold Brilinta 5 days preop for surgeries or procedures.       Relevant Orders   EKG 12-Lead (Completed)   Hyperlipidemia associated with type 2 diabetes mellitus ([place]) (Chronic)    Labs from April looked good.  LDL 45.  Continue current dose of statin.  Continue dietary modification.  Labs followed by PCP.       Essential hypertension (Chronic)    Blood pressure looks well controlled on current dose of carvedilol, amlodipine.  She is not having any issues with hypotension or syncope.         Other   History of MI (myocardial infarction) (Chronic)    Distant history of MI with PCI to the LCx and nondominant RCA occluded.  Normal EF and no wall motion abnormality. No true heart failure or angina symptoms.  On stable regimen.        ===================================  HPI:    Cindy Weber is a 61 y.o. female with a history of CAD-MI (2014-PCI x2) below who presents today for  annual follow-up, at the request of Lois Huxley, [place].  Cindy Weber was last seen on Feb 13, 2021-doing fairly well.  Still a little upset about her husband having passed away.  She also had COVID-19 with a long cough.  Her brother just had an LVAD.  This is a bridge for transplant versus destination.  Noted persistent cough.  Since having to care for her husband she did not get back into doing exercise.  Therefore deconditioned and somewhat dyspneic.  Recent Hospitalizations: None  Reviewed  CV studies:    The following studies were reviewed today: (if available, images/films reviewed: From Epic Chart or Care Everywhere) None:   Interval History:   Cindy Weber returns today for annual follow-up noting that she is not as active as she probably should be.  She is not exercising anymore because she is doing Night School for Medical Coding/Billing.  Her goal is to be out of complete her degree and start working in the coding department where she can then work a lot from home which will give her freedom to be available as caregiver for her son.  Overall, she is doing pretty well lots of social stress (social history) she has been quite upset of late.  From a cardiac standpoint she does state that she gets short of breath with mopping or doing other chores, but denies any chest pain or pressure.  No  true exertional dyspnea unless she overdoes it.  She gets plenty of exercise or activity walking around the cancer center all day.  She is mostly limited by hip and knee pain.  She is otherwise very stable from my standpoint with no significant other symptoms. Cardiovascular ROS: positive for - -more deconditioned, therefore notes of exertional dyspnea.  Trivial edema.  Some palpitations due to anxiety or stress-no prolonged symptoms negative for - chest pain, edema, orthopnea, paroxysmal nocturnal dyspnea, rapid heart rate, shortness of breath, or lightheadedness, dizziness or wooziness,  syncope/near syncope or TIA/amaurosis fugax, claudication   REVIEWED OF SYSTEMS   Review of Systems - Negative except symptoms noted above in HPI and below.  He does describe being more sedentary and therefore somewhat deconditioned. General ROS: positive for  - fatigue and weight gain negative for - chills, fever, hot flashes, or weight gain Respiratory ROS: no cough, shortness of breath, or wheezing negative for - cough, orthopnea, or pleuritic pain Gastrointestinal ROS: no abdominal pain, change in bowel habits, or black or bloody stools   Cough/wheeze from allergies =.> can get complicated by Eye Styes b/c issues with minimal eye-lashes (since chemo in 2013)Grieving with related insomnia  I have reviewed and (if needed) personally updated the patient's problem list, medications, allergies, past medical and surgical history, social and family history.   PAST MEDICAL HISTORY   Past Medical History:  Diagnosis Date   Breast cancer in female [facility]) 2013   - Lumpectomy & LN resection.  Chemo -- > residual hair loss (eye brows & hair);    Coronary artery disease involving native heart with angina pectoris ([place]) 02/2013   Non-STEMI - 100% CTO RCA (small codominant posterior and.; Normal LAD. The distal circumflex 95% stenosis - PCI with DES 2 (Xience expedition stents overlapping -2.25 mm x 15 mm and 2.5 mm x 23 mm both postdilated 2.5 mm balloon)   DM (diabetes mellitus), type 2 with complications (HCC)    CAD - MI.    Essential hypertension    Hyperlipidemia associated with type 2 diabetes mellitus ([place])    Non-STEMI (non-ST elevated myocardial infarction) ([place]) 2014   PCI - (2 stents) -formerly cared for by Dr. Redmond Pulling [facility].    PAST SURGICAL HISTORY   Past Surgical History:  Procedure Laterality Date   BREAST LUMPECTOMY Left 2014   CARDIAC CATHETERIZATION  02/15/2013   [facility] (Dr. Daiva Huge): Codominant system.  100% CTO of small RCA.  d Cx 95%, normal LAD. -> PCI of  dCx   LEFT OOPHORECTOMY  1998   PERCUTANEOUS CORONARY STENT INTERVENTION (PCI-S)  02/16/2013   [facility] -(Dr. Daiva Huge): PCI dCX 95%,- Xience expedition DES 2.25 mm x 15 mm overlap approximately with a Xience exhibition DES 2.5 mm x 23 mm -postdilated to 2.5 mm throughout   TRANSTHORACIC ECHOCARDIOGRAM  02/15/2013   [facility]: Normal LV Size & function. EF ~55-60%.  GR1DD. Mild MR & LAE.     Immunization History  Administered Date(s) Administered   Influenza Split 07/13/2018, 06/26/2020   Influenza, Seasonal, Injecte, Preservative Fre 07/19/2016   Influenza,inj,Quad PF,6-35 Mos 07/16/2021   PFIZER Comirnaty(Gray Top)Covid-19 Tri-Sucrose Vaccine 03/20/2021   PFIZER(Purple Top)SARS-COV-2 Vaccination 10/03/2019, 10/22/2019, 07/21/2020   Pneumococcal Polysaccharide-23 09/17/2018   Td 08/11/1989   Tdap 01/15/2021    MEDICATIONS/ALLERGIES   Current Meds  Medication Sig   albuterol (VENTOLIN HFA) 108 (90 Base) MCG/ACT inhaler Inhale 2 puffs into the lungs every 6 (six) hours as needed.   amLODipine (NORVASC) 5  MG tablet Take 1 tablet by mouth once a day   atorvastatin (LIPITOR) 40 MG tablet Take 1/2 tablet by mouth once a day   Blood Glucose Monitoring Suppl (FREESTYLE LITE) w/Device KIT For use when checking blood sugars. Fingerstick once a day.   buPROPion (WELLBUTRIN XL) 150 MG 24 hr tablet Take 1 tablet by mouth twice daily as directed -  FOR SMOKING CESSATION   carvedilol (COREG) 25 MG tablet Take 1 tablet by mouth twice a day   cetirizine (ZYRTEC) 10 MG tablet 1 tablet   clotrimazole (LOTRIMIN) 1 % cream Apply externally twice a day for 7 days.   diclofenac Sodium (VOLTAREN) 1 % GEL Apply 2 grams topically to affected area of foot 2- 4 times daily as directe.   empagliflozin (JARDIANCE) 25 MG TABS tablet Take 1 tablet by mouth once a day   fluconazole (DIFLUCAN) 150 MG tablet Take 1 tablet by mouth once now and once in 48 hours   glucose blood (FREESTYLE LITE) test strip Use as directed once  daily   glucose blood test strip Use to check blood sugar 2 times a day   Ibuprofen-Famotidine (DUEXIS) 800-26.6 MG TABS Take 1 tablet by mouth 2 (two) times daily as needed.   Lancets (FREESTYLE) lancets Use as directed once a day   letrozole (FEMARA) 2.5 MG tablet Take 1 tablet by mouth daily.   metFORMIN (GLUCOPHAGE) 500 MG tablet Take 1 tablet with a meal by mouth twice a day   ofloxacin (OCUFLOX) 0.3 % ophthalmic solution Place 1 drop into affected eye four times a day for 7 days   omeprazole (PRILOSEC) 40 MG capsule Take 1 capsule by mouth once a day   Semaglutide (RYBELSUS) 7 MG TABS Take 1 tablet by mouth daily at least 30 minutes before first food, beverage or other oral medicine of the day.   ticagrelor (BRILINTA) 60 MG TABS tablet TAKE 1 TABLET BY MOUTH TWO TIMES DAILY (NEEDS ANNUAL VISIT)   vitamin B-12 (CYANOCOBALAMIN) 500 MCG tablet Take 1 tablet by mouth daily.   _0  valACYclovir (VALTREX) 1000 MG tablet Take 1 tablet (1,000 mg total) by mouth 3 (three) times daily.    Allergies  Allergen Reactions   Lisinopril     Other reaction(s): cough   Losartan Potassium     Other reaction(s): cuogh   Valsartan Cough   Other Rash    Pt reports that hair and skin dye burned her skin.    SOCIAL HISTORY/FAMILY HISTORY   Reviewed in Epic:  Pertinent findings:  Social History   Tobacco Use   Smoking status: Every Day    Packs/day: 1.00    Years: 40.00    Pack years: 40.00    Types: Cigarettes   Smokeless tobacco: Never   Tobacco comments:    3-5 cigs per day   Social History   Social History Narrative   She is a recent widow.     Her husband, Montine Circle (a retired Network engineer) died on August 12, 9797. ->  Complications of [id number].  He has been very disabled up to that point, and simply decompensated.   -> She is now working on try to downsize the house.  Trying to get the estate together.      She smokes about half pack a day and has done so for many years. No recreational  drug use. Rare alcohol use.   She and Vicente Males had 3 children aged 7, 69 and 47 (as of 2021)   She  currently works as a Chartered certified accountant for [employer] Son 55 - Type 1 DM (since 65) -- gastroparesis = > living with her now.  Granddaughter's Fiance' died in 12/04/2022 - complications of SBE -> funeral Weekend of 02/17/2022  OBJCTIVE -PE, EKG, labs   Wt Readings from Last 3 Encounters:  02/17/22 180 lb 9.6 oz (81.9 kg)  09/17/21 184 lb (83.5 kg)  08/01/21 189 lb 9.6 oz (86 kg)    Physical Exam: BP 110/70 (BP Location: Left Arm)   Pulse 85   Ht _0  (1.651 m)   Wt 180 lb 9.6 oz (81.9 kg)   SpO2 94%   BMI 30.05 kg/m  Physical Exam Vitals reviewed.  Constitutional:      General: She is not in acute distress.    Appearance: Normal appearance. She is obese. She is not ill-appearing or toxic-appearing.  HENT:     Head: Normocephalic and atraumatic.  Cardiovascular:     Rate and Rhythm: Normal rate and regular rhythm.     Pulses: Normal pulses.     Heart sounds: Normal heart sounds. No murmur heard.   No friction rub. No gallop.  Pulmonary:     Effort: Pulmonary effort is normal. No respiratory distress.     Breath sounds: Normal breath sounds. No stridor. No wheezing, rhonchi or rales.  Chest:     Chest wall: No tenderness.  Musculoskeletal:        General: No swelling. Normal range of motion.     Cervical back: Normal range of motion and neck supple. Tenderness present.  Skin:    General: Skin is warm and dry.  Neurological:     General: No focal deficit present.     Mental Status: She is alert and oriented to person, place, and time.     Motor: No weakness.     Gait: Gait normal.  Psychiatric:        Mood and Affect: Mood normal.        Behavior: Behavior normal.        Thought Content: Thought content normal.        Judgment: Judgment normal.   Obese.  Otherwise normal   Adult ECG Report  Rate: 85 ;  Rhythm: normal sinus rhythm and left atrial enlargement.   LVH.  Inferior MI age-indeterminate. ;   Narrative Interpretation: Stable  Recent Labs:   01/24/2022 TC 113, TG 281, HDL 24, LDL 45.  A1c 6.5, Hgb 14.8, Cr 0.77, K+ 4.7.  ==================================================  COVID-19 Education: The signs and symptoms of COVID-19 were discussed with the patient and how to seek care for testing (follow up with PCP or arrange E-visit).    I spent a total of 60mnutes with the patient spent in direct patient consultation.  Additional time spent with chart review  / charting (studies, outside notes, etc): 16 min Total Time: n/a min  Current medicines are reviewed at length with the patient today.  (+/- concerns) n/a  Notice: This dictation was prepared with Dragon dictation along with smart phrase technology. Any transcriptional errors that result from this process are unintentional and may not be corrected upon review.  Studies Ordered:   Orders Placed This Encounter  Procedures   EKG 12-Lead   No orders of the defined types were placed in this encounter.   Patient Instructions / Medication Changes & Studies & Tests Ordered   Patient Instructions  Medication Instructions:  NO CHANGES  *If you need a  refill on your cardiac medications before your next appointment, please call your pharmacy*   Lab Work: NOT NEEDED    Testing/Procedures: NOT NEEDED   Follow-Up: At [facility], you and your health needs are our priority.  As part of our continuing mission to provide you with exceptional heart care, we have created designated Provider Care Teams.  These Care Teams include your primary Cardiologist (physician) and Advanced Practice Providers (APPs -  Physician Assistants and Nurse Practitioners) who all work together to provide you with the care you need, when you need it.     Your next appointment:   12 month(s)  The format for your next appointment:   In Person  Provider:   Glenetta Hew, MD      Glenetta Hew,  M.D., M.S. Interventional Cardiologist   Pager # [phone] Phone # [phone] [address] [place], [place] [address]   Thank you for choosing Heartcare at [facility]!!

## 2022-02-17 NOTE — Patient Instructions (Signed)

## 2022-02-26 ENCOUNTER — Other Ambulatory Visit (HOSPITAL_COMMUNITY): Payer: Self-pay

## 2022-03-16 ENCOUNTER — Other Ambulatory Visit (HOSPITAL_COMMUNITY): Payer: Self-pay

## 2022-03-16 ENCOUNTER — Encounter: Payer: Self-pay | Admitting: Cardiology

## 2022-03-16 NOTE — Assessment & Plan Note (Signed)
Distant history of MI with PCI to the LCx and nondominant RCA occluded.  Normal EF and no wall motion abnormality. No true heart failure or angina symptoms.  On stable regimen.

## 2022-03-16 NOTE — Assessment & Plan Note (Signed)
Labs from April looked good.  LDL 45.  Continue current dose of statin.  Continue dietary modification.  Labs followed by PCP.

## 2022-03-16 NOTE — Assessment & Plan Note (Signed)
Blood pressure looks well controlled on current dose of carvedilol, amlodipine.  She is not having any issues with hypotension or syncope.

## 2022-03-16 NOTE — Assessment & Plan Note (Signed)
No real anginal symptoms since her MI back in 2014.  Some twinging sensations off and on, but nothing significant.  She has exertional dyspnea and fatigue more related to deconditioning.  Continue to encourage her to exercise.  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  But hard to get out and go for a walk for least 15 minutes twice a day.  Plan:  Continue carvedilol, amlodipine and atorvastatin at current dose.  Converted to calcium channel blocker from ARB because of cough  On Jardiance and Rybelsus  On maintenance dose Brilinta 60 mg twice daily  Okay to hold Brilinta 5 days preop for surgeries or procedures.

## 2022-03-17 ENCOUNTER — Other Ambulatory Visit (HOSPITAL_COMMUNITY): Payer: Self-pay

## 2022-03-17 MED ORDER — RYBELSUS 7 MG PO TABS
ORAL_TABLET | ORAL | 4 refills | Status: DC
  Filled 2022-03-17: qty 30, 30d supply, fill #0
  Filled 2022-04-18: qty 30, 30d supply, fill #1
  Filled 2022-05-22: qty 30, 30d supply, fill #2
  Filled 2022-06-24: qty 30, 30d supply, fill #3
  Filled 2022-12-12: qty 30, 30d supply, fill #4

## 2022-03-18 ENCOUNTER — Other Ambulatory Visit (HOSPITAL_COMMUNITY): Payer: Self-pay

## 2022-04-07 ENCOUNTER — Other Ambulatory Visit (HOSPITAL_COMMUNITY): Payer: Self-pay

## 2022-04-07 ENCOUNTER — Other Ambulatory Visit: Payer: Self-pay | Admitting: Internal Medicine

## 2022-04-08 ENCOUNTER — Other Ambulatory Visit (HOSPITAL_COMMUNITY): Payer: Self-pay

## 2022-04-18 ENCOUNTER — Other Ambulatory Visit: Payer: Self-pay | Admitting: Cardiology

## 2022-04-18 ENCOUNTER — Other Ambulatory Visit (HOSPITAL_COMMUNITY): Payer: Self-pay

## 2022-04-18 MED ORDER — TICAGRELOR 60 MG PO TABS
60.0000 mg | ORAL_TABLET | Freq: Two times a day (BID) | ORAL | 3 refills | Status: DC
  Filled 2022-04-18: qty 180, 90d supply, fill #0
  Filled 2022-07-19: qty 180, 90d supply, fill #1
  Filled 2022-10-29: qty 180, 90d supply, fill #2
  Filled 2023-01-30: qty 180, 90d supply, fill #3

## 2022-04-21 ENCOUNTER — Other Ambulatory Visit (HOSPITAL_COMMUNITY): Payer: Self-pay

## 2022-05-09 DIAGNOSIS — H52223 Regular astigmatism, bilateral: Secondary | ICD-10-CM | POA: Diagnosis not present

## 2022-05-09 DIAGNOSIS — H524 Presbyopia: Secondary | ICD-10-CM | POA: Diagnosis not present

## 2022-05-10 ENCOUNTER — Other Ambulatory Visit (HOSPITAL_COMMUNITY): Payer: Self-pay

## 2022-05-10 MED ORDER — NEOMYCIN-POLYMYXIN-DEXAMETH 3.5-10000-0.1 OP SUSP
OPHTHALMIC | 1 refills | Status: AC
Start: 2022-05-09 — End: ?
  Filled 2022-05-10: qty 5, 26d supply, fill #0

## 2022-05-10 MED ORDER — CEPHALEXIN 500 MG PO CAPS
500.0000 mg | ORAL_CAPSULE | Freq: Two times a day (BID) | ORAL | 0 refills | Status: AC
  Filled 2022-05-10: qty 14, 7d supply, fill #0

## 2022-05-20 ENCOUNTER — Other Ambulatory Visit (HOSPITAL_COMMUNITY): Payer: Self-pay

## 2022-05-22 ENCOUNTER — Other Ambulatory Visit (HOSPITAL_COMMUNITY): Payer: Self-pay

## 2022-05-30 DIAGNOSIS — H25813 Combined forms of age-related cataract, bilateral: Secondary | ICD-10-CM | POA: Diagnosis not present

## 2022-05-30 DIAGNOSIS — E119 Type 2 diabetes mellitus without complications: Secondary | ICD-10-CM | POA: Diagnosis not present

## 2022-06-09 ENCOUNTER — Other Ambulatory Visit (HOSPITAL_COMMUNITY): Payer: Self-pay

## 2022-06-10 ENCOUNTER — Other Ambulatory Visit (HOSPITAL_COMMUNITY): Payer: Self-pay

## 2022-06-10 MED ORDER — FLUCONAZOLE 150 MG PO TABS
ORAL_TABLET | ORAL | 2 refills | Status: AC
  Filled 2022-06-10: qty 4, 30d supply, fill #0
  Filled 2023-06-05: qty 4, 30d supply, fill #1

## 2022-06-24 ENCOUNTER — Other Ambulatory Visit (HOSPITAL_COMMUNITY): Payer: Self-pay

## 2022-07-09 ENCOUNTER — Other Ambulatory Visit (HOSPITAL_COMMUNITY): Payer: Self-pay

## 2022-07-22 ENCOUNTER — Other Ambulatory Visit (HOSPITAL_COMMUNITY): Payer: Self-pay

## 2022-08-03 ENCOUNTER — Other Ambulatory Visit (HOSPITAL_COMMUNITY): Payer: Self-pay

## 2022-08-04 ENCOUNTER — Other Ambulatory Visit (HOSPITAL_COMMUNITY): Payer: Self-pay

## 2022-08-06 ENCOUNTER — Other Ambulatory Visit (HOSPITAL_COMMUNITY): Payer: Self-pay

## 2022-08-06 MED ORDER — JARDIANCE 25 MG PO TABS
25.0000 mg | ORAL_TABLET | Freq: Every day | ORAL | 0 refills | Status: DC
  Filled 2022-08-06: qty 90, 90d supply, fill #0

## 2022-08-07 ENCOUNTER — Other Ambulatory Visit (HOSPITAL_COMMUNITY): Payer: Self-pay

## 2022-08-13 ENCOUNTER — Ambulatory Visit (INDEPENDENT_AMBULATORY_CARE_PROVIDER_SITE_OTHER): Payer: 59 | Admitting: Internal Medicine

## 2022-08-13 ENCOUNTER — Encounter: Payer: Self-pay | Admitting: Internal Medicine

## 2022-08-13 VITALS — BP 110/68 | HR 76 | Temp 98.1°F | Ht 65.0 in | Wt 181.0 lb

## 2022-08-13 DIAGNOSIS — R053 Chronic cough: Secondary | ICD-10-CM | POA: Diagnosis not present

## 2022-08-13 DIAGNOSIS — F17210 Nicotine dependence, cigarettes, uncomplicated: Secondary | ICD-10-CM | POA: Diagnosis not present

## 2022-08-13 DIAGNOSIS — F17200 Nicotine dependence, unspecified, uncomplicated: Secondary | ICD-10-CM

## 2022-08-13 NOTE — Patient Instructions (Addendum)
I think your cough is likely related to your smoking.  You can try coming of cetirizine in case that is drying you out too much and try flonase instead for allergy symptoms. Try sipping on warm liquids throughout the day, using lozenges or hard candy - avoid mint/menthol which can make things worse.  Honey and lemon are very soothing and honey is a natural cough suppressant.   Flonase - 1 spray on each side of your nose twice a day for first week, then 1 spray on each side.   Instructions for use: If you also use a saline nasal spray or rinse, use that first. Position the head with the chin slightly tucked. Use the right hand to spray into the left nostril and the right hand to spray into the left nostril.   Point the bottle away from the septum of your nose (cartilage that divides the two sides of your nose).  Hold the nostril closed on the opposite side from where you will spray Spray once and gently sniff to pull the medicine into the higher parts of your nose.  Don't sniff too hard as the medicine will drain down the back of your throat instead. Repeat with a second spray on the same side if prescribed. Repeat on the other side of your nose.  What are the benefits of quitting smoking? Quitting smoking can lower your chances of getting or dying from heart disease, lung disease, kidney failure, infection, or cancer. It can also lower your chances of getting osteoporosis, a condition that makes your bones weak. Plus, quitting smoking can help your skin look younger and reduce the chances that you will have problems with sex.  Quitting smoking will improve your health no matter how old you are, and no matter how long or how much you have smoked.  What should I do if I want to quit smoking? The letters in the word "START" can help you remember the steps to take: S = Set a quit date. T = Tell family, friends, and the people around you that you plan to quit. A = Anticipate or plan ahead for the  tough times you'll face while quitting. R = Remove cigarettes and other tobacco products from your home, car, and work. T = Talk to your doctor about getting help to quit.  How can my doctor or nurse help? Your doctor or nurse can give you advice on the best way to quit. He or she can also put you in touch with counselors or other people you can call for support. Plus, your doctor or nurse can give you medicines to: ?Reduce your craving for cigarettes ?Reduce the unpleasant symptoms that happen when you stop smoking (called "withdrawal symptoms"). You can also get help from a free phone line (1-800-QUIT-NOW) or go online to [place]fr.  What are the symptoms of withdrawal? The symptoms include: ?Trouble sleeping ?Being irritable, anxious or restless ?Getting frustrated or angry ?Having trouble thinking clearly  Some people who stop smoking become temporarily depressed. Some people need treatment for depression, such as counseling or antidepressant medicines. Depressed people might: ?No longer enjoy or care about doing the things they used to like to do ?Feel sad, down, hopeless, nervous, or cranky most of the day, almost every day ?Lose or gain weight ?Sleep too much or too little ?Feel tired or like they have no energy ?Feel guilty or like they are worth nothing ?Forget things or feel confused ?Move and speak more slowly than usual ?  Act restless or have trouble staying still ?Think about death or suicide  If you think you might be depressed, see your doctor or nurse. Only someone trained in mental health can tell for sure if you are depressed. If you ever feel like you might hurt yourself, go straight to the nearest emergency department. Or you can call for an ambulance (in the [place] and [place], [place] 9-1-1) or call your doctor or nurse right away and tell them it is an emergency. You can also reach the [place] National Suicide Prevention Lifeline at 364-084-4404 or  http://walker-sanchez.info/.  How do medicines help you stop smoking? Different medicines work in different ways: ?Nicotine replacement therapy eases withdrawal and reduces your body's craving for nicotine, the main drug found in cigarettes. There are different forms of nicotine replacement, including skin patches, lozenges, gum, nasal sprays, and "puffers" or inhalers. Many can be bought without a prescription, while others might require one. ?Bupropion is a prescription medicine that reduces your desire to smoke. This medicine is sold under the brand names Zyban and Wellbutrin. It is also available in a generic version, which is cheaper than brand name medicines. ?Varenicline (brand names: Chantix, Champix) is a prescription medicine that reduces withdrawal symptoms and cigarette cravings. If you think you'd like to take varenicline and you have a history of depression, anxiety, or heart disease, discuss this with your doctor or nurse before taking the medicine. Varenicline can also increase the effects of alcohol in some people. It's a good idea to limit drinking while you're taking it, at least until you know how it affects you.  How does counseling work? Counseling can happen during formal office visits or just over the phone. A counselor can help you: ?Figure out what triggers your smoking and what to do instead ?Overcome cravings ?Figure out what went wrong when you tried to quit before  What works best? Studies show that people have the best luck at quitting if they take medicines to help them quit and work with a Social worker. It might also be helpful to combine nicotine replacement with one of the prescription medicines that help people quit. In some cases, it might even make sense to take bupropion and varenicline together.  What about e-cigarettes? Sometimes people wonder if using electronic cigarettes, or "e-cigarettes," might help them quit smoking. Using e-cigarettes is also  called "vaping." Doctors do not recommend e-cigarettes in place of medicines and counseling. That's because e-cigarettes still contain nicotine as well as other substances that might be harmful. It's not clear how they can affect a person's health in the long term.  Will I gain weight if I quit? Yes, you might gain a few pounds. But quitting smoking will have a much more positive effect on your health than weighing a few pounds more. Plus, you can help prevent some weight gain by being more active and eating less. Taking the medicine bupropion might help control weight gain.   What else can I do to improve my chances of quitting? You can: ?Start exercising. ?Stay away from smokers and places that you associate with smoking. If people close to you smoke, ask them to quit with you. ?Keep gum, hard candy, or something to put in your mouth handy. If you get a craving for a cigarette, try one of these instead. ?Don't give up, even if you start smoking again. It takes most people a few tries before they succeed.  What if I am pregnant and I smoke? If you are  pregnant, it's really important for the health of your baby that you quit. Ask your doctor what options you have, and what is safest for your baby

## 2022-08-13 NOTE — Progress Notes (Signed)
Cindy Weber    [id number]    1961/07/10  Primary Care Physician:Becker, Mancel Bale, PA Date of Appointment: 08/13/2022 Established Patient Visit  Chief complaint:   Chief Complaint  Patient presents with   Follow-up    Cough, SOB     HPI: Cindy Weber is a 61 y.o. woman with tobacco use disorder and coughing.   Interval Updates: Here for one year follow for cough suspected to be related to GERD, on PPI. She notes her dry cough is persistent.Taking it every night and doesn't seem to be helping. Cough is not all day, usually at work, worse with talking. She feels a tickle in her throat. Thick mucus comes up. Notices it mostly with talking. She does clear her throat. No nocturnal symptoms. Its present in the morning and then usually gets worse with talking as the day goes on. No nasal drainage, sinus congestion. Has been on cetirizine for 3-4 years with pcp for seasonal allergies and feels that she is very dry every day.   She is still smoking and this has actually increased over the past year due to personal stressors. 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 Her granddaughter's fiancee passed away from a severe infection and complications from endocarditis.   I have reviewed the patient's family social and past medical history and updated as appropriate.   Past Medical History:  Diagnosis Date   Breast cancer in female [facility]) 2013   - Lumpectomy & LN resection.  Chemo -- > residual hair loss (eye brows & hair);    Coronary artery disease involving native heart with angina pectoris ([place]) 02/2013   Non-STEMI - 100% CTO RCA (small codominant posterior and.; Normal LAD. The distal circumflex 95% stenosis - PCI with DES 2 (Xience expedition stents overlapping -2.25 mm x 15 mm and 2.5 mm x 23 mm both postdilated 2.5 mm balloon)   DM (diabetes mellitus), type 2 with complications (HCC)    CAD - MI.    Essential  hypertension    Hyperlipidemia associated with type 2 diabetes mellitus ([place])    Non-STEMI (non-ST elevated myocardial infarction) ([place]) 2014   PCI - (2 stents) -formerly cared for by Dr. Redmond Pulling [facility].    Past Surgical History:  Procedure Laterality Date   BREAST LUMPECTOMY Left 2014   CARDIAC CATHETERIZATION  02/15/2013   [facility] (Dr. Daiva Huge): Codominant system.  100% CTO of small RCA.  d Cx 95%, normal LAD. -> PCI of dCx   LEFT OOPHORECTOMY  1998   PERCUTANEOUS CORONARY STENT INTERVENTION (PCI-S)  02/16/2013   [facility] -(Dr. Daiva Huge): PCI dCX 95%,- Xience expedition DES 2.25 mm x 15 mm overlap approximately with a Xience exhibition DES 2.5 mm x 23 mm -postdilated to 2.5 mm throughout   TRANSTHORACIC ECHOCARDIOGRAM  02/15/2013   [facility]: Normal LV Size & function. EF ~55-60%.  GR1DD. Mild MR & LAE.     Family History  Problem Relation Age of Onset   Diabetes Mother    Heart disease Mother    Liver disease Father    Healthy Sister    Hypertension Brother    Diabetes Maternal Grandfather    Diabetes Sister    Hypertension Sister    Heart disease Brother    Heart attack Brother    Diabetes Brother    Hypertension Brother    Hypertension Daughter    Hypertension  Son    Diabetes Son    Diabetes Son     Social History   Occupational History   Not on file  Tobacco Use   Smoking status: Every Day    Packs/day: 1.00    Years: 40.00    Total pack years: 40.00    Types: Cigarettes   Smokeless tobacco: Never   Tobacco comments:    3-5 cigs per day  Substance and Sexual Activity   Alcohol use: Not on file   Drug use: Not on file   Sexual activity: Not on file     Physical Exam: Blood pressure 110/68, pulse 76, temperature 98.1 F (36.7 C), temperature source Oral, height '5\' 5"'$  (1.651 m), weight 181 lb (82.1 kg), SpO2 97 %.  Gen:      No acute distress ENT:  +cobblestoning in oropharynx Lungs:    ctab no wheezes or crackles CV:        RRR no mrg   Data  Reviewed: Imaging:   PFTs:     Latest Ref Rng & Units 09/17/2021    9:49 AM  PFT Results  FVC-Pre L 2.72   FVC-Predicted Pre % 99   FVC-Post L 2.70   FVC-Predicted Post % 99   Pre FEV1/FVC % % 74   Post FEV1/FCV % % 74   FEV1-Pre L 2.00   FEV1-Predicted Pre % 93   FEV1-Post L 2.00   DLCO uncorrected ml/min/mmHg 14.72   DLCO UNC% % 70   DLCO corrected ml/min/mmHg 14.72   DLCO COR %Predicted % 70   DLVA Predicted % 85   TLC L 4.61   TLC % Predicted % 88   RV % Predicted % 91    I have personally reviewed the patient's PFTs and normal pulmonary function.   Labs:  Immunization status: Immunization History  Administered Date(s) Administered   Influenza Split 07/13/2018, 06/26/2020   Influenza, Seasonal, Injecte, Preservative Fre 07/19/2016   Influenza,inj,Quad PF,6-35 Mos 07/16/2021   PFIZER Comirnaty(Gray Top)Covid-19 Tri-Sucrose Vaccine 03/20/2021   PFIZER(Purple Top)SARS-COV-2 Vaccination 10/03/2019, 10/22/2019, 07/21/2020   Pneumococcal Polysaccharide-23 09/17/2018   Td 08/11/1989   Tdap 01/15/2021      Assessment:  Chronic Cough GERD 40 pack year smoking history - normal PFT CAD s/p PCI 2014 Breast cancer s/p chemo/rad/lumpectomy GERD on PPI  Plan/Recommendations: I think your cough is likely related to your smoking.  Stop cetirizine in case that is drying you out too much and try flonase instead for allergy symptoms. Try sipping on warm liquids throughout the day, using lozenges or hard candy - avoid mint/menthol which can make things worse.  Honey and lemon are very soothing and honey is a natural cough suppressant.   Smoking Cessation Counseling:  1. The patient is an everyday smoker and symptomatic due to the following condition CAD 2. The patient is currently contemplative in quitting smoking. 3. I advised patient to quit smoking. 4. We identified patient specific barriers to change.  5. I personally spent 3 minutes counseling the patient  regarding tobacco use disorder. 6. We discussed management of stress and anxiety to help with smoking cessation, when applicable. 7. We discussed nicotine replacement therapy, Wellbutrin, Chantix as possible options. 8. I advised setting a quit date. 9. Follow?up arranged with our office to continue ongoing discussions. 10.Resources given to patient including quit hotline.   Return to Care: Return if symptoms worsen or fail to improve.   Lenice Llamas, MD Pulmonary and [place]

## 2022-08-28 ENCOUNTER — Other Ambulatory Visit (HOSPITAL_COMMUNITY): Payer: Self-pay

## 2022-08-28 MED ORDER — AMLODIPINE BESYLATE 5 MG PO TABS
5.0000 mg | ORAL_TABLET | Freq: Every day | ORAL | 0 refills | Status: DC
  Filled 2022-08-28: qty 30, 30d supply, fill #0

## 2022-08-28 MED ORDER — BUPROPION HCL ER (XL) 150 MG PO TB24
150.0000 mg | ORAL_TABLET | Freq: Two times a day (BID) | ORAL | 0 refills | Status: DC
  Filled 2022-08-28: qty 60, 30d supply, fill #0

## 2022-08-28 MED ORDER — METFORMIN HCL 500 MG PO TABS
500.0000 mg | ORAL_TABLET | Freq: Two times a day (BID) | ORAL | 0 refills | Status: DC
  Filled 2022-08-28: qty 60, 30d supply, fill #0

## 2022-08-29 ENCOUNTER — Other Ambulatory Visit (HOSPITAL_COMMUNITY): Payer: Self-pay

## 2022-08-30 ENCOUNTER — Other Ambulatory Visit (HOSPITAL_COMMUNITY): Payer: Self-pay

## 2022-09-16 ENCOUNTER — Other Ambulatory Visit (HOSPITAL_COMMUNITY): Payer: Self-pay

## 2022-09-16 DIAGNOSIS — I2510 Atherosclerotic heart disease of native coronary artery without angina pectoris: Secondary | ICD-10-CM | POA: Diagnosis not present

## 2022-09-16 DIAGNOSIS — E7800 Pure hypercholesterolemia, unspecified: Secondary | ICD-10-CM | POA: Diagnosis not present

## 2022-09-16 DIAGNOSIS — K219 Gastro-esophageal reflux disease without esophagitis: Secondary | ICD-10-CM | POA: Diagnosis not present

## 2022-09-16 DIAGNOSIS — Z23 Encounter for immunization: Secondary | ICD-10-CM | POA: Diagnosis not present

## 2022-09-16 DIAGNOSIS — I700 Atherosclerosis of aorta: Secondary | ICD-10-CM | POA: Diagnosis not present

## 2022-09-16 DIAGNOSIS — I10 Essential (primary) hypertension: Secondary | ICD-10-CM | POA: Diagnosis not present

## 2022-09-16 DIAGNOSIS — E1169 Type 2 diabetes mellitus with other specified complication: Secondary | ICD-10-CM | POA: Diagnosis not present

## 2022-09-16 DIAGNOSIS — E669 Obesity, unspecified: Secondary | ICD-10-CM | POA: Diagnosis not present

## 2022-09-16 DIAGNOSIS — F17200 Nicotine dependence, unspecified, uncomplicated: Secondary | ICD-10-CM | POA: Diagnosis not present

## 2022-09-16 DIAGNOSIS — I25119 Atherosclerotic heart disease of native coronary artery with unspecified angina pectoris: Secondary | ICD-10-CM | POA: Diagnosis not present

## 2022-09-16 MED ORDER — JARDIANCE 25 MG PO TABS
25.0000 mg | ORAL_TABLET | Freq: Every day | ORAL | 0 refills | Status: DC
  Filled 2022-09-16 – 2022-10-29 (×2): qty 90, 90d supply, fill #0

## 2022-09-16 MED ORDER — ATORVASTATIN CALCIUM 40 MG PO TABS
20.0000 mg | ORAL_TABLET | Freq: Every day | ORAL | 0 refills | Status: DC
  Filled 2022-09-16 – 2023-04-29 (×2): qty 45, 90d supply, fill #0

## 2022-09-16 MED ORDER — OMEPRAZOLE 40 MG PO CPDR
40.0000 mg | DELAYED_RELEASE_CAPSULE | Freq: Every day | ORAL | 0 refills | Status: DC
  Filled 2022-09-16 – 2022-10-31 (×2): qty 90, 90d supply, fill #0

## 2022-09-16 MED ORDER — AMLODIPINE BESYLATE 5 MG PO TABS
5.0000 mg | ORAL_TABLET | Freq: Every day | ORAL | 0 refills | Status: DC
  Filled 2022-09-16 – 2022-10-02 (×2): qty 90, 90d supply, fill #0

## 2022-09-16 MED ORDER — FLUCONAZOLE 150 MG PO TABS
150.0000 mg | ORAL_TABLET | Freq: Every day | ORAL | 2 refills | Status: AC
  Filled 2022-09-16: qty 4, 30d supply, fill #0
  Filled 2022-12-12: qty 4, 30d supply, fill #1
  Filled 2023-03-26: qty 4, 30d supply, fill #2

## 2022-09-16 MED ORDER — METFORMIN HCL 500 MG PO TABS
500.0000 mg | ORAL_TABLET | Freq: Two times a day (BID) | ORAL | 0 refills | Status: DC
  Filled 2022-09-16 – 2022-10-14 (×2): qty 180, 90d supply, fill #0

## 2022-09-16 MED ORDER — RYBELSUS 7 MG PO TABS
ORAL_TABLET | ORAL | 0 refills | Status: DC
  Filled 2022-09-16: qty 90, 90d supply, fill #0

## 2022-09-16 MED ORDER — CARVEDILOL 25 MG PO TABS
25.0000 mg | ORAL_TABLET | Freq: Two times a day (BID) | ORAL | 0 refills | Status: DC
  Filled 2022-09-16: qty 180, 90d supply, fill #0

## 2022-09-17 ENCOUNTER — Other Ambulatory Visit (HOSPITAL_COMMUNITY): Payer: Self-pay

## 2022-09-17 ENCOUNTER — Other Ambulatory Visit: Payer: Self-pay

## 2022-10-02 ENCOUNTER — Other Ambulatory Visit (HOSPITAL_COMMUNITY): Payer: Self-pay

## 2022-10-02 ENCOUNTER — Other Ambulatory Visit: Payer: Self-pay

## 2022-10-02 MED ORDER — BUPROPION HCL ER (XL) 150 MG PO TB24
150.0000 mg | ORAL_TABLET | Freq: Two times a day (BID) | ORAL | 2 refills | Status: DC
  Filled 2022-10-02: qty 60, 30d supply, fill #0
  Filled 2022-10-29: qty 60, 30d supply, fill #1
  Filled 2022-12-02: qty 60, 30d supply, fill #2

## 2022-10-14 ENCOUNTER — Other Ambulatory Visit (HOSPITAL_COMMUNITY): Payer: Self-pay

## 2022-10-17 ENCOUNTER — Other Ambulatory Visit: Payer: Self-pay | Admitting: Family Medicine

## 2022-10-17 DIAGNOSIS — Z1231 Encounter for screening mammogram for malignant neoplasm of breast: Secondary | ICD-10-CM

## 2022-10-29 ENCOUNTER — Other Ambulatory Visit: Payer: Self-pay

## 2022-10-29 ENCOUNTER — Other Ambulatory Visit (HOSPITAL_COMMUNITY): Payer: Self-pay

## 2022-10-31 ENCOUNTER — Other Ambulatory Visit: Payer: Self-pay

## 2022-11-04 ENCOUNTER — Other Ambulatory Visit: Payer: Self-pay | Admitting: Podiatry

## 2022-11-05 ENCOUNTER — Other Ambulatory Visit (HOSPITAL_COMMUNITY): Payer: Self-pay

## 2022-12-02 ENCOUNTER — Other Ambulatory Visit (HOSPITAL_COMMUNITY): Payer: Self-pay

## 2022-12-12 ENCOUNTER — Other Ambulatory Visit (HOSPITAL_COMMUNITY): Payer: Self-pay

## 2022-12-18 ENCOUNTER — Other Ambulatory Visit (HOSPITAL_COMMUNITY): Payer: Self-pay

## 2022-12-18 ENCOUNTER — Ambulatory Visit
Admission: RE | Admit: 2022-12-18 | Discharge: 2022-12-18 | Disposition: A | Discharge status: Home / Self Care | Payer: Commercial Managed Care - PPO | Source: Ambulatory Visit | Attending: Family Medicine | Admitting: Family Medicine

## 2022-12-18 DIAGNOSIS — E1169 Type 2 diabetes mellitus with other specified complication: Secondary | ICD-10-CM | POA: Diagnosis not present

## 2022-12-18 DIAGNOSIS — I2510 Atherosclerotic heart disease of native coronary artery without angina pectoris: Secondary | ICD-10-CM | POA: Diagnosis not present

## 2022-12-18 DIAGNOSIS — K219 Gastro-esophageal reflux disease without esophagitis: Secondary | ICD-10-CM | POA: Diagnosis not present

## 2022-12-18 DIAGNOSIS — E7800 Pure hypercholesterolemia, unspecified: Secondary | ICD-10-CM | POA: Diagnosis not present

## 2022-12-18 DIAGNOSIS — Z1231 Encounter for screening mammogram for malignant neoplasm of breast: Secondary | ICD-10-CM

## 2022-12-18 DIAGNOSIS — I700 Atherosclerosis of aorta: Secondary | ICD-10-CM | POA: Diagnosis not present

## 2022-12-18 DIAGNOSIS — I25119 Atherosclerotic heart disease of native coronary artery with unspecified angina pectoris: Secondary | ICD-10-CM | POA: Diagnosis not present

## 2022-12-18 DIAGNOSIS — R053 Chronic cough: Secondary | ICD-10-CM | POA: Diagnosis not present

## 2022-12-18 DIAGNOSIS — I10 Essential (primary) hypertension: Secondary | ICD-10-CM | POA: Diagnosis not present

## 2022-12-18 DIAGNOSIS — F17200 Nicotine dependence, unspecified, uncomplicated: Secondary | ICD-10-CM | POA: Diagnosis not present

## 2022-12-18 DIAGNOSIS — E669 Obesity, unspecified: Secondary | ICD-10-CM | POA: Diagnosis not present

## 2022-12-18 MED ORDER — METFORMIN HCL 500 MG PO TABS
500.0000 mg | ORAL_TABLET | Freq: Two times a day (BID) | ORAL | 1 refills | Status: AC
  Filled 2023-01-13: qty 180, 90d supply, fill #0
  Filled 2023-04-05: qty 180, 90d supply, fill #1

## 2022-12-18 MED ORDER — CARVEDILOL 25 MG PO TABS
25.0000 mg | ORAL_TABLET | Freq: Two times a day (BID) | ORAL | 1 refills | Status: DC
  Filled 2022-12-18: qty 180, 90d supply, fill #0
  Filled 2023-03-26: qty 180, 90d supply, fill #1

## 2022-12-18 MED ORDER — AMLODIPINE BESYLATE 5 MG PO TABS
5.0000 mg | ORAL_TABLET | Freq: Every day | ORAL | 1 refills | Status: DC
  Filled 2022-12-27: qty 90, 90d supply, fill #0
  Filled 2023-03-26: qty 90, 90d supply, fill #1

## 2022-12-18 MED ORDER — FLUCONAZOLE 150 MG PO TABS
150.0000 mg | ORAL_TABLET | Freq: Every day | ORAL | 2 refills | Status: AC | PRN
  Filled 2022-12-18: qty 4, 4d supply, fill #0

## 2022-12-18 MED ORDER — BUPROPION HCL ER (XL) 150 MG PO TB24
150.0000 mg | ORAL_TABLET | Freq: Two times a day (BID) | ORAL | 1 refills | Status: AC
  Filled 2022-12-27: qty 180, 90d supply, fill #0
  Filled 2023-03-26: qty 180, 90d supply, fill #1

## 2022-12-18 MED ORDER — JARDIANCE 25 MG PO TABS
25.0000 mg | ORAL_TABLET | Freq: Every day | ORAL | 1 refills | Status: DC
  Filled 2023-01-30: qty 90, 90d supply, fill #0
  Filled 2023-04-29: qty 90, 90d supply, fill #1

## 2022-12-18 MED ORDER — OMEPRAZOLE 40 MG PO CPDR
40.0000 mg | DELAYED_RELEASE_CAPSULE | Freq: Every day | ORAL | 1 refills | Status: DC
  Filled 2023-02-06: qty 90, 90d supply, fill #0
  Filled 2023-06-04: qty 90, 90d supply, fill #1

## 2022-12-18 MED ORDER — DICLOFENAC SODIUM 1 % EX GEL
CUTANEOUS | 1 refills | Status: DC
  Filled 2022-12-18: qty 100, 25d supply, fill #0
  Filled 2023-02-16: qty 100, 25d supply, fill #1

## 2022-12-18 MED ORDER — ATORVASTATIN CALCIUM 40 MG PO TABS
20.0000 mg | ORAL_TABLET | Freq: Every day | ORAL | 1 refills | Status: DC
  Filled 2022-12-18: qty 45, 90d supply, fill #0

## 2022-12-20 ENCOUNTER — Other Ambulatory Visit (HOSPITAL_COMMUNITY): Payer: Self-pay

## 2022-12-22 ENCOUNTER — Other Ambulatory Visit (HOSPITAL_COMMUNITY): Payer: Self-pay

## 2022-12-23 ENCOUNTER — Other Ambulatory Visit (HOSPITAL_COMMUNITY): Payer: Self-pay

## 2022-12-23 MED ORDER — RYBELSUS 7 MG PO TABS
14.0000 mg | ORAL_TABLET | Freq: Every day | ORAL | 1 refills | Status: DC
  Filled 2022-12-23: qty 60, 30d supply, fill #0

## 2022-12-26 IMAGING — CR DG CHEST 2V
2 series · 2 of 2 positions shown · non-contrast
Comparison: Chest x-ray 08/06/2018.

CLINICAL DATA: 59-year-old female with history of chronic cough for
the past 8 months.

EXAM:
CHEST - 2 VIEW

[w chest pa]
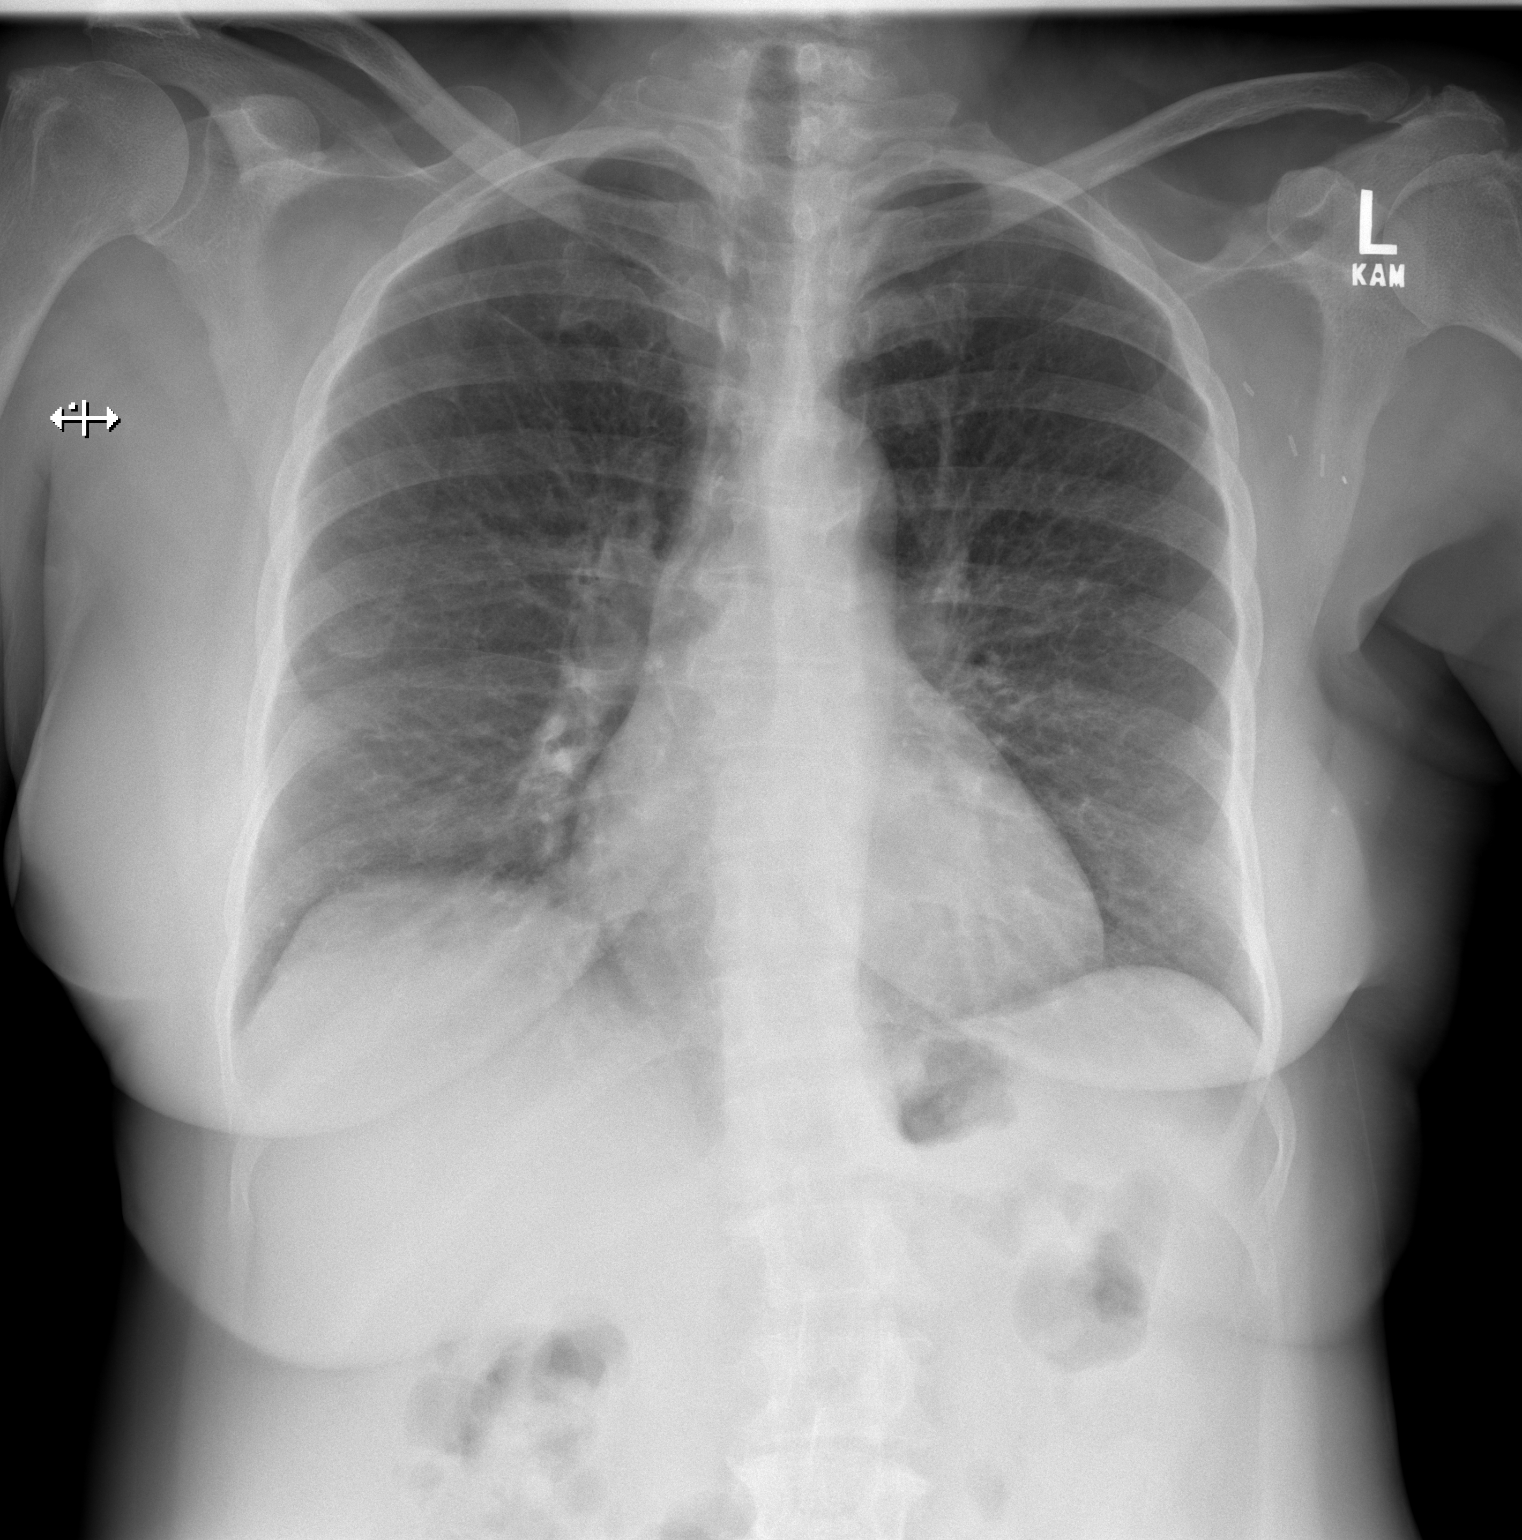

[w chest lat]
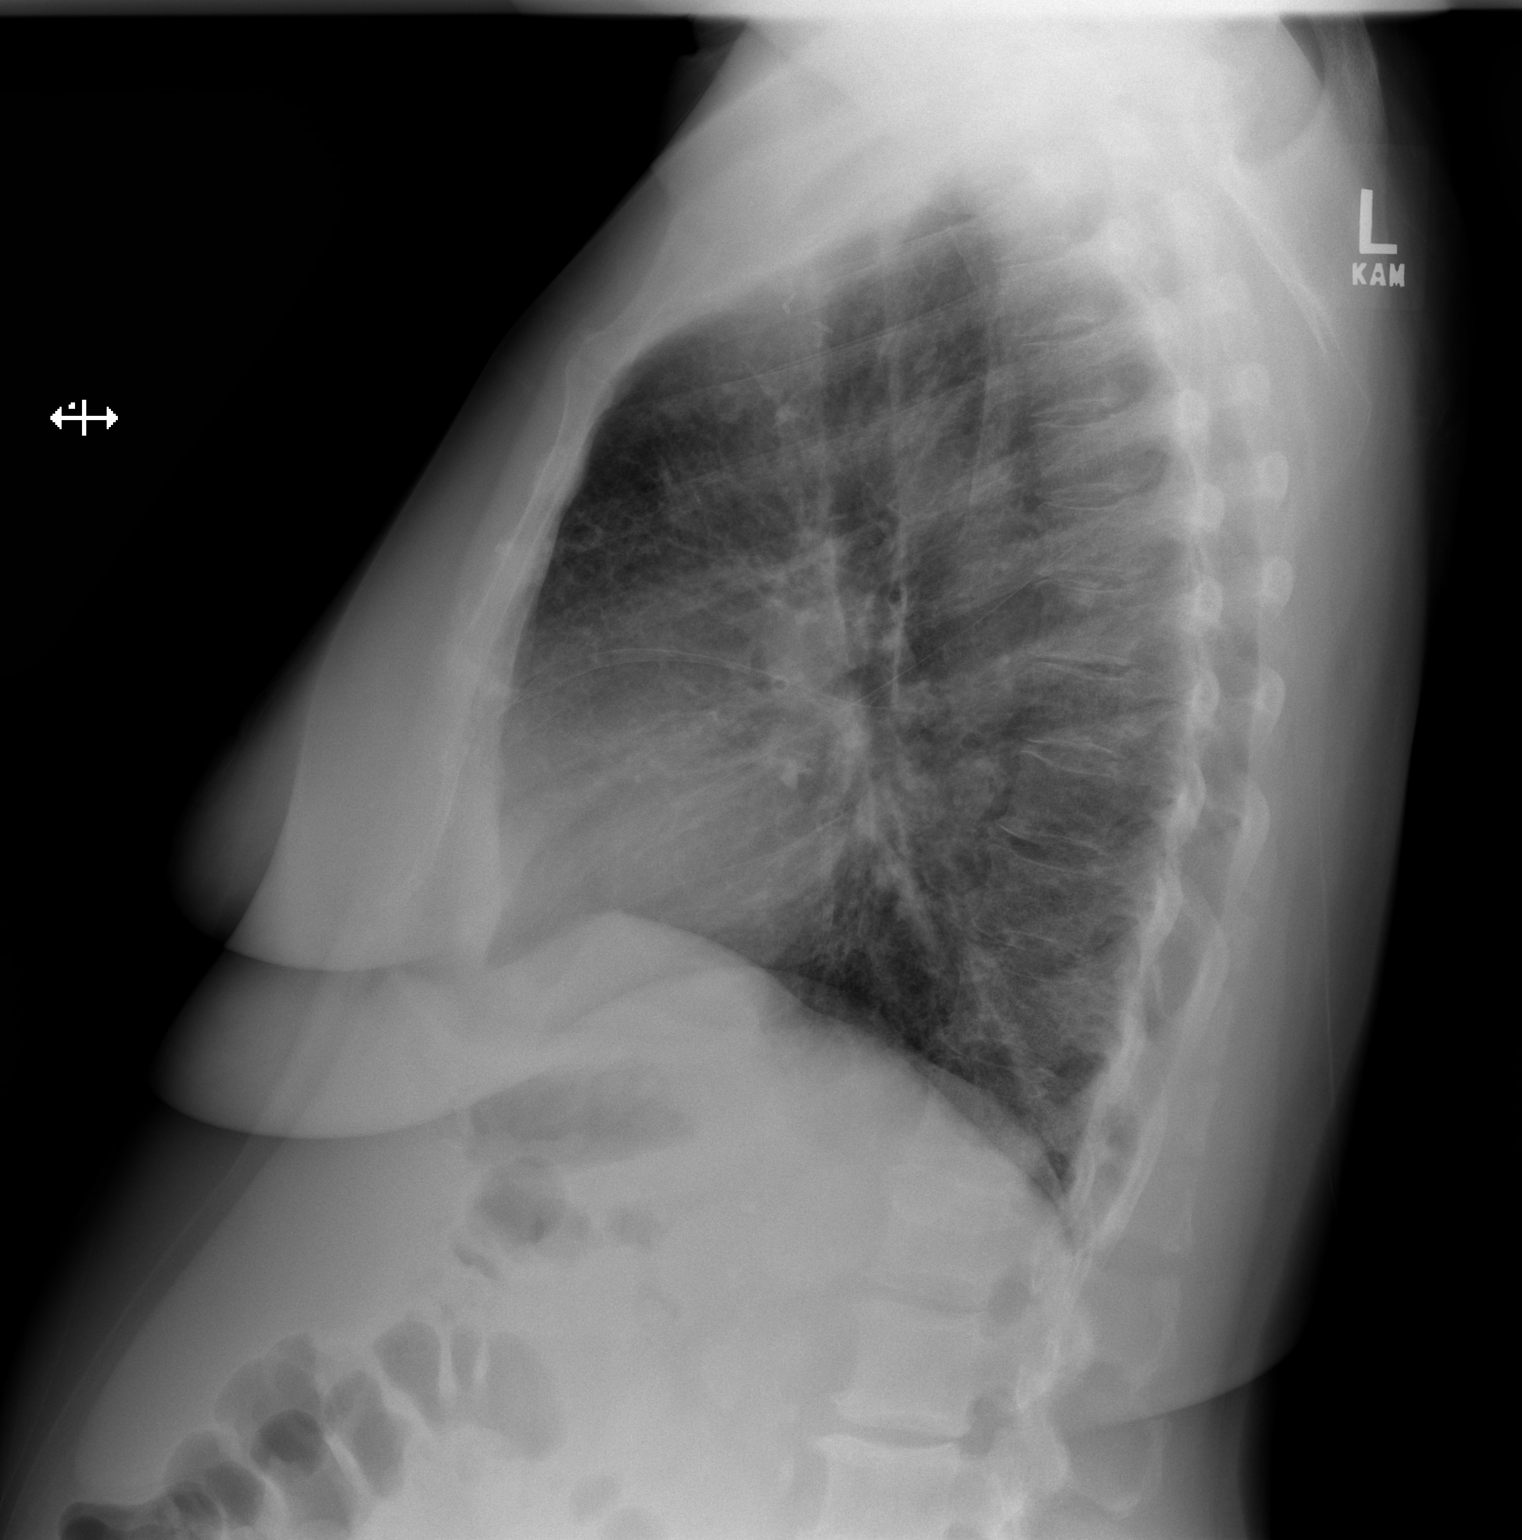

[2 of 2 positions shown; findings below may reference images not displayed]

FINDINGS: Lung volumes are normal. No consolidative airspace disease. No
pleural effusions. No pneumothorax. No pulmonary nodule or mass
noted. Pulmonary vasculature and the cardiomediastinal silhouette
are within normal limits. Atherosclerosis in the thoracic aorta.
Surgical clips in the left axillary region, presumably from prior
lymph node dissection.
IMPRESSION: 1.  No radiographic evidence of acute cardiopulmonary disease.
2. Aortic atherosclerosis.

## 2022-12-27 ENCOUNTER — Other Ambulatory Visit (HOSPITAL_COMMUNITY): Payer: Self-pay

## 2023-01-02 ENCOUNTER — Other Ambulatory Visit (HOSPITAL_COMMUNITY): Payer: Self-pay

## 2023-01-05 ENCOUNTER — Other Ambulatory Visit (HOSPITAL_COMMUNITY): Payer: Self-pay

## 2023-01-06 ENCOUNTER — Other Ambulatory Visit (HOSPITAL_COMMUNITY): Payer: Self-pay

## 2023-01-06 MED ORDER — RYBELSUS 14 MG PO TABS
ORAL_TABLET | ORAL | 1 refills | Status: DC
  Filled 2023-01-06: qty 30, 30d supply, fill #0
  Filled 2023-02-06: qty 30, 30d supply, fill #1
  Filled 2023-03-10: qty 30, 30d supply, fill #2
  Filled 2023-04-05: qty 30, 30d supply, fill #3
  Filled 2023-05-05: qty 30, 30d supply, fill #4
  Filled 2023-06-04: qty 30, 30d supply, fill #5

## 2023-01-06 MED ORDER — RYBELSUS 7 MG PO TABS
ORAL_TABLET | ORAL | 1 refills | Status: DC
  Filled 2023-01-06: qty 180, 90d supply, fill #0

## 2023-01-07 ENCOUNTER — Other Ambulatory Visit (HOSPITAL_COMMUNITY): Payer: Self-pay

## 2023-01-13 ENCOUNTER — Other Ambulatory Visit (HOSPITAL_COMMUNITY): Payer: Self-pay

## 2023-01-30 ENCOUNTER — Other Ambulatory Visit (HOSPITAL_COMMUNITY): Payer: Self-pay

## 2023-02-02 ENCOUNTER — Other Ambulatory Visit (HOSPITAL_COMMUNITY): Payer: Self-pay

## 2023-02-02 MED ORDER — ATORVASTATIN CALCIUM 40 MG PO TABS
20.0000 mg | ORAL_TABLET | Freq: Every day | ORAL | 0 refills | Status: DC
  Filled 2023-02-02: qty 45, 90d supply, fill #0

## 2023-02-06 ENCOUNTER — Other Ambulatory Visit (HOSPITAL_COMMUNITY): Payer: Self-pay

## 2023-03-10 ENCOUNTER — Other Ambulatory Visit (HOSPITAL_COMMUNITY): Payer: Self-pay

## 2023-03-11 ENCOUNTER — Other Ambulatory Visit (HOSPITAL_COMMUNITY): Payer: Self-pay

## 2023-03-26 ENCOUNTER — Other Ambulatory Visit (HOSPITAL_COMMUNITY): Payer: Self-pay

## 2023-03-27 DIAGNOSIS — E1169 Type 2 diabetes mellitus with other specified complication: Secondary | ICD-10-CM | POA: Diagnosis not present

## 2023-03-27 DIAGNOSIS — E781 Pure hyperglyceridemia: Secondary | ICD-10-CM | POA: Diagnosis not present

## 2023-03-28 ENCOUNTER — Other Ambulatory Visit (HOSPITAL_COMMUNITY): Payer: Self-pay

## 2023-03-30 ENCOUNTER — Other Ambulatory Visit (HOSPITAL_COMMUNITY): Payer: Self-pay

## 2023-03-30 MED ORDER — BUPROPION HCL ER (XL) 150 MG PO TB24
150.0000 mg | ORAL_TABLET | Freq: Two times a day (BID) | ORAL | 1 refills | Status: AC
  Filled 2023-03-30 – 2023-04-05 (×2): qty 180, 90d supply, fill #0

## 2023-03-31 ENCOUNTER — Other Ambulatory Visit (HOSPITAL_COMMUNITY): Payer: Self-pay

## 2023-04-01 ENCOUNTER — Other Ambulatory Visit (HOSPITAL_COMMUNITY): Payer: Self-pay

## 2023-04-06 ENCOUNTER — Other Ambulatory Visit (HOSPITAL_COMMUNITY): Payer: Self-pay

## 2023-04-06 ENCOUNTER — Other Ambulatory Visit: Payer: Self-pay

## 2023-04-08 DIAGNOSIS — E119 Type 2 diabetes mellitus without complications: Secondary | ICD-10-CM | POA: Diagnosis not present

## 2023-04-08 DIAGNOSIS — M25551 Pain in right hip: Secondary | ICD-10-CM | POA: Diagnosis not present

## 2023-04-27 DIAGNOSIS — E119 Type 2 diabetes mellitus without complications: Secondary | ICD-10-CM | POA: Diagnosis not present

## 2023-04-27 DIAGNOSIS — M7061 Trochanteric bursitis, right hip: Secondary | ICD-10-CM | POA: Diagnosis not present

## 2023-04-29 ENCOUNTER — Other Ambulatory Visit (HOSPITAL_COMMUNITY): Payer: Self-pay

## 2023-04-29 ENCOUNTER — Other Ambulatory Visit: Payer: Self-pay | Admitting: Cardiology

## 2023-04-29 MED ORDER — TICAGRELOR 60 MG PO TABS
60.0000 mg | ORAL_TABLET | Freq: Two times a day (BID) | ORAL | 0 refills | Status: DC
  Filled 2023-04-29: qty 60, 30d supply, fill #0

## 2023-04-30 ENCOUNTER — Other Ambulatory Visit (HOSPITAL_COMMUNITY): Payer: Self-pay

## 2023-04-30 ENCOUNTER — Other Ambulatory Visit: Payer: Self-pay

## 2023-04-30 MED ORDER — ATORVASTATIN CALCIUM 40 MG PO TABS
20.0000 mg | ORAL_TABLET | Freq: Every day | ORAL | 3 refills | Status: DC
  Filled 2023-04-30 – 2023-06-04 (×2): qty 45, 90d supply, fill #0

## 2023-05-05 ENCOUNTER — Other Ambulatory Visit (HOSPITAL_COMMUNITY): Payer: Self-pay

## 2023-05-06 ENCOUNTER — Other Ambulatory Visit (HOSPITAL_COMMUNITY): Payer: Self-pay

## 2023-05-06 MED ORDER — GLUCOSE BLOOD VI STRP
ORAL_STRIP | 3 refills | Status: AC
  Filled 2023-05-06: qty 100, 50d supply, fill #0

## 2023-05-07 ENCOUNTER — Other Ambulatory Visit (HOSPITAL_COMMUNITY): Payer: Self-pay

## 2023-06-04 ENCOUNTER — Other Ambulatory Visit: Payer: Self-pay | Admitting: Cardiology

## 2023-06-04 ENCOUNTER — Other Ambulatory Visit (HOSPITAL_COMMUNITY): Payer: Self-pay

## 2023-06-04 ENCOUNTER — Other Ambulatory Visit: Payer: Self-pay

## 2023-06-04 MED ORDER — DICLOFENAC SODIUM 1 % EX GEL
1.0000 | Freq: Every day | CUTANEOUS | 1 refills | Status: DC | PRN
  Filled 2023-06-04: qty 100, 30d supply, fill #0
  Filled 2023-11-05: qty 100, 30d supply, fill #1

## 2023-06-04 MED ORDER — TICAGRELOR 60 MG PO TABS
30.0000 mg | ORAL_TABLET | Freq: Two times a day (BID) | ORAL | 0 refills | Status: DC
  Filled 2023-06-04: qty 60, 60d supply, fill #0

## 2023-06-04 MED ORDER — AMLODIPINE BESYLATE 5 MG PO TABS
5.0000 mg | ORAL_TABLET | Freq: Every day | ORAL | 1 refills | Status: DC
  Filled 2023-06-04 – 2023-06-28 (×2): qty 90, 90d supply, fill #0
  Filled 2023-10-02 – 2023-10-03 (×2): qty 90, 90d supply, fill #1

## 2023-06-05 ENCOUNTER — Other Ambulatory Visit (HOSPITAL_COMMUNITY): Payer: Self-pay

## 2023-06-05 ENCOUNTER — Other Ambulatory Visit: Payer: Self-pay

## 2023-06-05 MED ORDER — RYBELSUS 7 MG PO TABS
7.0000 mg | ORAL_TABLET | Freq: Every morning | ORAL | 0 refills | Status: AC
Start: 2023-06-05 — End: ?
  Filled 2023-06-05: qty 30, 30d supply, fill #0
  Filled 2023-06-24 – 2023-06-28 (×2): qty 90, 90d supply, fill #0
  Filled 2023-06-29 – 2023-07-02 (×2): qty 30, 30d supply, fill #0
  Filled 2023-09-02: qty 30, 30d supply, fill #1
  Filled 2024-01-24: qty 30, 30d supply, fill #2

## 2023-06-08 ENCOUNTER — Other Ambulatory Visit (HOSPITAL_COMMUNITY): Payer: Self-pay

## 2023-06-24 ENCOUNTER — Other Ambulatory Visit (HOSPITAL_COMMUNITY): Payer: Self-pay

## 2023-06-24 DIAGNOSIS — M7061 Trochanteric bursitis, right hip: Secondary | ICD-10-CM | POA: Diagnosis not present

## 2023-06-24 MED ORDER — CARVEDILOL 25 MG PO TABS
25.0000 mg | ORAL_TABLET | Freq: Two times a day (BID) | ORAL | 0 refills | Status: DC
  Filled 2023-06-24: qty 180, 90d supply, fill #0

## 2023-06-28 ENCOUNTER — Other Ambulatory Visit (HOSPITAL_COMMUNITY): Payer: Self-pay

## 2023-06-29 ENCOUNTER — Other Ambulatory Visit (HOSPITAL_COMMUNITY): Payer: Self-pay

## 2023-06-29 ENCOUNTER — Other Ambulatory Visit: Payer: Self-pay

## 2023-07-02 ENCOUNTER — Other Ambulatory Visit: Payer: Self-pay | Admitting: Cardiology

## 2023-07-03 ENCOUNTER — Other Ambulatory Visit (HOSPITAL_COMMUNITY): Payer: Self-pay

## 2023-07-03 ENCOUNTER — Other Ambulatory Visit: Payer: Self-pay

## 2023-07-03 MED ORDER — TICAGRELOR 60 MG PO TABS
30.0000 mg | ORAL_TABLET | Freq: Two times a day (BID) | ORAL | 0 refills | Status: DC
  Filled 2023-07-03 – 2023-07-24 (×3): qty 30, 30d supply, fill #0

## 2023-07-15 ENCOUNTER — Other Ambulatory Visit: Payer: Self-pay

## 2023-07-15 ENCOUNTER — Other Ambulatory Visit (HOSPITAL_COMMUNITY): Payer: Self-pay

## 2023-07-15 DIAGNOSIS — K219 Gastro-esophageal reflux disease without esophagitis: Secondary | ICD-10-CM | POA: Diagnosis not present

## 2023-07-15 DIAGNOSIS — Z0000 Encounter for general adult medical examination without abnormal findings: Secondary | ICD-10-CM | POA: Diagnosis not present

## 2023-07-15 DIAGNOSIS — I700 Atherosclerosis of aorta: Secondary | ICD-10-CM | POA: Diagnosis not present

## 2023-07-15 DIAGNOSIS — I25119 Atherosclerotic heart disease of native coronary artery with unspecified angina pectoris: Secondary | ICD-10-CM | POA: Diagnosis not present

## 2023-07-15 DIAGNOSIS — E1169 Type 2 diabetes mellitus with other specified complication: Secondary | ICD-10-CM | POA: Diagnosis not present

## 2023-07-15 DIAGNOSIS — F17200 Nicotine dependence, unspecified, uncomplicated: Secondary | ICD-10-CM | POA: Diagnosis not present

## 2023-07-15 DIAGNOSIS — E669 Obesity, unspecified: Secondary | ICD-10-CM | POA: Diagnosis not present

## 2023-07-15 DIAGNOSIS — I2510 Atherosclerotic heart disease of native coronary artery without angina pectoris: Secondary | ICD-10-CM | POA: Diagnosis not present

## 2023-07-15 DIAGNOSIS — I10 Essential (primary) hypertension: Secondary | ICD-10-CM | POA: Diagnosis not present

## 2023-07-15 DIAGNOSIS — E7800 Pure hypercholesterolemia, unspecified: Secondary | ICD-10-CM | POA: Diagnosis not present

## 2023-07-15 MED ORDER — METFORMIN HCL 500 MG PO TABS
500.0000 mg | ORAL_TABLET | Freq: Two times a day (BID) | ORAL | 1 refills | Status: DC
  Filled 2023-07-15: qty 180, 90d supply, fill #0
  Filled 2023-10-13: qty 180, 90d supply, fill #1

## 2023-07-15 MED ORDER — ATORVASTATIN CALCIUM 40 MG PO TABS
20.0000 mg | ORAL_TABLET | Freq: Every day | ORAL | 3 refills | Status: DC
  Filled 2023-07-15: qty 45, 90d supply, fill #0

## 2023-07-15 MED ORDER — FLUCONAZOLE 150 MG PO TABS
150.0000 mg | ORAL_TABLET | Freq: Every day | ORAL | 2 refills | Status: AC
  Filled 2023-07-15: qty 4, 4d supply, fill #0
  Filled 2023-09-02: qty 4, 4d supply, fill #1
  Filled 2023-11-26: qty 4, 4d supply, fill #2

## 2023-07-15 MED ORDER — JARDIANCE 25 MG PO TABS
25.0000 mg | ORAL_TABLET | Freq: Every day | ORAL | 1 refills | Status: DC
  Filled 2023-07-15 – 2023-09-02 (×3): qty 90, 90d supply, fill #0
  Filled 2023-11-26: qty 90, 90d supply, fill #1

## 2023-07-15 MED ORDER — OMEPRAZOLE 40 MG PO CPDR
40.0000 mg | DELAYED_RELEASE_CAPSULE | Freq: Every day | ORAL | 1 refills | Status: AC
  Filled 2023-09-02: qty 90, 90d supply, fill #0
  Filled 2024-01-07: qty 90, 90d supply, fill #1

## 2023-07-15 MED ORDER — CARVEDILOL 25 MG PO TABS
25.0000 mg | ORAL_TABLET | Freq: Two times a day (BID) | ORAL | 1 refills | Status: DC
  Filled 2023-10-02 – 2023-10-03 (×2): qty 180, 90d supply, fill #0

## 2023-07-15 MED ORDER — RYBELSUS 7 MG PO TABS
7.0000 mg | ORAL_TABLET | Freq: Every day | ORAL | 0 refills | Status: DC
  Filled 2023-07-15 – 2023-10-03 (×4): qty 90, 90d supply, fill #0

## 2023-07-15 MED ORDER — AMLODIPINE BESYLATE 5 MG PO TABS: 5.0000 mg | ORAL_TABLET | Freq: Every day | ORAL | 1 refills | Status: DC

## 2023-07-16 ENCOUNTER — Other Ambulatory Visit (HOSPITAL_COMMUNITY): Payer: Self-pay

## 2023-07-24 ENCOUNTER — Other Ambulatory Visit (HOSPITAL_COMMUNITY): Payer: Self-pay

## 2023-08-01 ENCOUNTER — Other Ambulatory Visit (HOSPITAL_COMMUNITY): Payer: Self-pay

## 2023-08-13 ENCOUNTER — Other Ambulatory Visit (HOSPITAL_COMMUNITY): Payer: Self-pay

## 2023-09-02 ENCOUNTER — Other Ambulatory Visit: Payer: Self-pay | Admitting: Cardiology

## 2023-09-02 ENCOUNTER — Other Ambulatory Visit (HOSPITAL_COMMUNITY): Payer: Self-pay

## 2023-09-02 ENCOUNTER — Other Ambulatory Visit: Payer: Self-pay

## 2023-09-02 MED ORDER — TICAGRELOR 60 MG PO TABS
30.0000 mg | ORAL_TABLET | Freq: Two times a day (BID) | ORAL | 0 refills | Status: DC
  Filled 2023-09-02: qty 15, 15d supply, fill #0

## 2023-09-15 ENCOUNTER — Other Ambulatory Visit (HOSPITAL_COMMUNITY): Payer: Self-pay

## 2023-09-15 ENCOUNTER — Telehealth: Payer: Self-pay | Admitting: Cardiology

## 2023-09-15 ENCOUNTER — Other Ambulatory Visit: Payer: Self-pay

## 2023-09-15 MED ORDER — TICAGRELOR 60 MG PO TABS
30.0000 mg | ORAL_TABLET | Freq: Two times a day (BID) | ORAL | 0 refills | Status: DC
  Filled 2023-09-15: qty 60, 60d supply, fill #0

## 2023-09-15 NOTE — Telephone Encounter (Signed)
*  STAT* If patient is at the pharmacy, call can be transferred to refill team.   1. Which medications need to be refilled? (please list name of each medication and dose if known)   ticagrelor (BRILINTA) 60 MG TABS tablet  Take 0.5 tablets (30 mg total) by mouth 2 (two) times daily. NEED OV.    2. Would you like to learn more about the convenience, safety, & potential cost savings by using the [facility]? Yes   3. Are you open to using the [facility] Yes   4. Which pharmacy/location (including street and city if local pharmacy) is medication to be sent to? [place] [facility]     5. Do they need a 30 day or 90 day supply? 30 Day Supply  Pt is scheduled for 10/15/23.

## 2023-09-16 ENCOUNTER — Telehealth: Payer: Self-pay

## 2023-09-16 DIAGNOSIS — Z7901 Long term (current) use of anticoagulants: Secondary | ICD-10-CM | POA: Diagnosis not present

## 2023-09-16 DIAGNOSIS — Z01818 Encounter for other preprocedural examination: Secondary | ICD-10-CM | POA: Diagnosis not present

## 2023-09-16 DIAGNOSIS — Z860100 Personal history of colon polyps, unspecified: Secondary | ICD-10-CM | POA: Diagnosis not present

## 2023-09-16 DIAGNOSIS — K219 Gastro-esophageal reflux disease without esophagitis: Secondary | ICD-10-CM | POA: Diagnosis not present

## 2023-09-16 NOTE — Telephone Encounter (Signed)
   Pre-operative Risk Assessment    Patient Name: Cindy Weber  DOB: [birth year] MRN: [id number]  Last seen: 02/17/2022 Next apt:10/15/2023     Request for Surgical Clearance    Procedure:   Colonoscopy Screening   Date of Surgery:  Clearance 10/22/23                                 Surgeon:  Dr. Lavonne Chick Group or Practice Name:  [facility] Phone number:  [phone] Fax number:  [phone]   Type of Clearance Requested:   - Medical  - Pharmacy:  Hold Ticagrelor (Brilinta) Hold 5 days prior    Type of Anesthesia:   Propofol   Additional requests/questions:   N/A  Berneda Rose   09/16/2023, 11:57 AM

## 2023-09-16 NOTE — Telephone Encounter (Signed)
    Primary Cardiologist:David Herbie [place], MD  Chart reviewed as part of pre-operative protocol coverage. Because of Cindy Weber's past medical history and time since last visit, he/she will require a follow-up visit in order to better assess preoperative cardiovascular risk.  Pre-op covering staff: - Please schedule Office appointment and call patient to inform them. - Please contact requesting surgeon's office via preferred method (i.e, phone, fax) to inform them of need for appointment prior to surgery.  If applicable, this message will also be routed to pharmacy pool and/or primary cardiologist for input on holding anticoagulant/antiplatelet agent as requested below so that this information is available at time of patient's appointment.   Ronney Asters, NP  09/16/2023, 1:09 PM

## 2023-09-18 NOTE — Telephone Encounter (Signed)
Patient is already scheduled for an appointment on 10/15/23 with Bettina Gavia, PA.

## 2023-10-03 ENCOUNTER — Other Ambulatory Visit (HOSPITAL_COMMUNITY): Payer: Self-pay

## 2023-10-05 ENCOUNTER — Other Ambulatory Visit (HOSPITAL_COMMUNITY): Payer: Self-pay

## 2023-10-11 NOTE — Progress Notes (Signed)
 Cardiology Office Note:    Date:  10/15/2023   ID:  Cindy Weber, DOB 1961/08/26, MRN [id number]  PCP:  Alben Therisa MATSU, PA   [facility] Providers Cardiologist:  Alm Clay, MD     Referring MD: Alben Therisa MATSU, [place]   Chief Complaint  Patient presents with   Pre-op Exam   Follow-up    CAD    History of Present Illness:    Cindy Weber is a 62 y.o. female with a hx of CAD, HTN,  HLD, and DM2.  She has a history of cough with ARB.  She has a remote history of MI with PCI to LCx and a RCA CTO (2014).  She has overlapping stents in the LCX.  No recent ischemic evaluation.  She was last seen by Dr. Clay 02/2022 and was deconditioned and slightly dyspneic at that time.   She presents today for preoperative risk evaluation prior to colonoscopy scheduled 10/21/2022 with [facility]. She has no cardiac complaints. Unclear why she is taking 30 mg brilinta  BID. I will prescribe 60 mg brilinta  BID.   She does share with me that her brother has an LVAD, lives in [place]. She is not sure about an amyloidosis/sarcoidosis. I've asked her to get more information. She is very concerned about her medications and wants to come off of some. We reviewed her medications and regimen.   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     Past Medical History:  Diagnosis Date   Breast cancer in female [facility]) 2013   - Lumpectomy & LN resection.  Chemo -- > residual hair loss (eye brows & hair);    Coronary artery disease involving native heart with angina pectoris (HCC) 02/2013   Non-STEMI - 100% CTO RCA (small codominant posterior and.; Normal LAD. The distal circumflex 95% stenosis - PCI with DES 2 (Xience expedition stents overlapping -2.25 mm x 15 mm and 2.5 mm x 23 mm both postdilated 2.5 mm balloon)   DM (diabetes mellitus), type 2 with complications (HCC)    CAD - MI.    Essential  hypertension    Hyperlipidemia associated with type 2 diabetes mellitus (HCC)    Non-STEMI (non-ST elevated myocardial infarction) (HCC) 2014   PCI - (2 stents) -formerly cared for by Dr. Tanda [facility].    Past Surgical History:  Procedure Laterality Date   BREAST LUMPECTOMY Left 2014   CARDIAC CATHETERIZATION  02/15/2013   [facility] (Dr. Grayce): Codominant system.  100% CTO of small RCA.  d Cx 95%, normal LAD. -> PCI of dCx   LEFT OOPHORECTOMY  1998   PERCUTANEOUS CORONARY STENT INTERVENTION (PCI-S)  02/16/2013   [facility] -(Dr. Grayce): PCI dCX 95%,- Xience expedition DES 2.25 mm x 15 mm overlap approximately with a Xience exhibition DES 2.5 mm x 23 mm -postdilated to 2.5 mm throughout   TRANSTHORACIC ECHOCARDIOGRAM  02/15/2013   [facility]: Normal LV Size & function. EF ~55-60%.  GR1DD. Mild MR & LAE.     Current Medications: Current Meds  Medication Sig   albuterol  (VENTOLIN  HFA) 108 (90 Base) MCG/ACT inhaler Inhale 2 puffs into the lungs every 6 (six) hours as needed.   Blood Glucose Monitoring Suppl (FREESTYLE LITE) w/Device KIT For use when checking blood sugars. Fingerstick once a day.   cephALEXin  (KEFLEX ) 500 MG capsule Take 1 capsule (500 mg  total) by mouth 2 (two) times daily.   cetirizine (ZYRTEC) 10 MG tablet 1 tablet   clotrimazole  (LOTRIMIN ) 1 % cream Apply externally twice a day for 7 days.   diclofenac  Sodium (VOLTAREN ) 1 % GEL Apply to affected area once a day as needed.   empagliflozin  (JARDIANCE ) 25 MG TABS tablet Take 1 tablet (25 mg total) by mouth daily.   fluconazole  (DIFLUCAN ) 150 MG tablet Take 1 tablet (150 mg total) by mouth daily as needed for yeast infection   glucose blood (FREESTYLE LITE) test strip Use as directed once daily   glucose blood test strip Use to test blood sugar 2 times a day   Ibuprofen -Famotidine  (DUEXIS ) 800-26.6 MG TABS Take 1 tablet by mouth 2 (two) times daily as needed.   Lancets (FREESTYLE) lancets Use as directed once a day    letrozole (FEMARA) 2.5 MG tablet Take 1 tablet by mouth daily.   metFORMIN  (GLUCOPHAGE ) 500 MG tablet Take 1 tablet (500 mg total) by mouth 2 (two) times daily with a meal.   neomycin -polymyxin b-dexamethasone  (MAXITROL ) 3.5-10000-0.1 SUSP Place 1 drop into both eyes 3 times a day for 1 week   ofloxacin  (OCUFLOX ) 0.3 % ophthalmic solution Place 1 drop into affected eye four times a day for 7 days   omeprazole  (PRILOSEC) 40 MG capsule Take 1 capsule (40 mg total) by mouth daily.   Semaglutide  (RYBELSUS ) 7 MG TABS Take 1 tablet (7 mg total) by mouth daily at least 30 minutes before first food, beverage or other oral medicine of the day.   vitamin B-12 (CYANOCOBALAMIN) 500 MCG tablet Take 1 tablet by mouth daily.   [DISCONTINUED] amLODipine  (NORVASC ) 5 MG tablet Take 1 tablet (5 mg total) by mouth daily.   [DISCONTINUED] atorvastatin  (LIPITOR) 40 MG tablet Take 0.5 tablets (20 mg total) by mouth daily.   [DISCONTINUED] carvedilol  (COREG ) 25 MG tablet Take 1 tablet (25 mg total) by mouth 2 (two) times daily.   [DISCONTINUED] ticagrelor  (BRILINTA ) 60 MG TABS tablet Take 0.5 tablets (30 mg total) by mouth 2 (two) times daily. NEED OV.     Allergies:   Lisinopril , Losartan potassium, Valsartan , and Other   Social History   Socioeconomic History   Marital status: Married    Spouse name: Cindy Weber   Number of children: 3   Years of education: 12   Highest education level: Not on file  Occupational History   Not on file  Tobacco Use   Smoking status: Every Day    Current packs/day: 1.00    Average packs/day: 1 pack/day for 40.0 years (40.0 ttl pk-yrs)    Types: Cigarettes   Smokeless tobacco: Never   Tobacco comments:    3-5 cigs per day  Substance and Sexual Activity   Alcohol use: Not on file   Drug use: Not on file   Sexual activity: Not on file  Other Topics Concern   Not on file  Social History Narrative   She is a recent widow.     Her husband, Cindy Weber (a retired psychologist, forensic) died on  09/05/2020. ->  Complications of COVID-19.  He has been very disabled up to that point, and simply decompensated.   -> She is now working on try to downsize the house.  Trying to get the estate together.      She smokes about half pack a day and has done so for many years. No recreational drug use. Rare alcohol use.   She and Carliss had 3  children aged 65, 62 and 12 (as of 2021)   She currently works as a psychologist, sport and exercise for [employer]   Social Drivers of Corporate Investment Banker Strain: Not on [employer] Insecurity: Not on file  Transportation Needs: Not on file  Physical Activity: Not on file  Stress: Not on file  Social Connections: Unknown (02/21/2022)   [facility] from [facility]   Social Network    Social Network: Not on file     Family History: The patient's family history includes Diabetes in her brother, maternal grandfather, mother, sister, son, and son; Healthy in her sister; Heart attack in her brother; Heart disease in her brother and mother; Hypertension in her brother, brother, daughter, sister, and son; Liver disease in her father.  ROS:   Please see the history of present illness.     All other systems reviewed and are negative.  EKGs/Labs/Other Studies Reviewed:    The following studies were reviewed today:       EKG Interpretation Date/Time:  Thursday October 15 2023 08:08:38 EST Ventricular Rate:  79 PR Interval:  184 QRS Duration:  78 QT Interval:  374 QTC Calculation: 428 R Axis:   -15  Text Interpretation: Normal sinus rhythm Normal ECG When compared with ECG of 01-Jul-2019 12:24, PR interval has decreased Nonspecific T wave abnormality no longer evident in Inferior leads Nonspecific T wave abnormality, improved in Lateral leads Confirmed by Madie Slough ([id number]) on 10/15/2023 8:17:47 AM    Recent Labs: No results found for requested labs within last 365 days.  Recent Lipid Panel No results found for: CHOL, TRIG, HDL,  CHOLHDL, VLDL, LDLCALC, LDLDIRECT   Risk Assessment/Calculations:                Physical Exam:    VS:  BP 104/60   Pulse 79   Ht 5' 5 (1.651 m)   Wt 183 lb (83 kg)   SpO2 96%   BMI 30.45 kg/m     Wt Readings from Last 3 Encounters:  10/15/23 183 lb (83 kg)  08/13/22 181 lb (82.1 kg)  02/17/22 180 lb 9.6 oz (81.9 kg)     GEN:  Well nourished, well developed in no acute distress HEENT: Normal NECK: No JVD; No carotid bruits LYMPHATICS: No lymphadenopathy CARDIAC: RRR, no murmurs, rubs, gallops RESPIRATORY:  Clear to auscultation without rales, wheezing or rhonchi  ABDOMEN: Soft, non-tender, non-distended MUSCULOSKELETAL:  No edema; No deformity  SKIN: Warm and dry NEUROLOGIC:  Alert and oriented x 3 PSYCHIATRIC:  Normal affect   ASSESSMENT:    1. Essential hypertension   2. CAD S/P percutaneous coronary angioplasty   3. Hyperlipidemia with target LDL less than 70   4. DM (diabetes mellitus), type 2 with complications (HCC)   5. Preoperative cardiovascular examination    PLAN:    In order of problems listed above:  CAD Known CTO of RCA DES x 2 overlapping 2.25 x 15 mm and 2.5 x 23 mm in the distal LCX - she remains on 60 mg brilinta  maintenance dose - agree with this - no chest pain   Hypertension - 5 mg amlodipine , 25 mg coreg  BID - agree with these, no medication changes    Hyperlipidemia with LDL goal < 70 - 40 mg lipitor Recent labs with PCP 07/2023: Total cholesterol: 107 HDL: 34 LDL: 55 Triglycerides 91   DM - on jardiance  - A1c 6.5%   Preoperative risk evaluation prior to colonoscopy  scheduled 10/21/2022 with [facility].   Requesting 5 day brilinta  hold.  Per Dr. Anner, may hold Brilinta  5 days prior to surgical procedures.  Given overlapping stents and length of stents, we preferred to start aspirin while holding Brilinta .  She may proceed with colonoscopy without further cardiovascular testing as she can complete 4.0 METS  without angina.  I will fax recommendation to take aspirin while holding Brilinta , and have asked her to follow-up with GI.   Follow-up in 1 year.  Recommend checking an LPA with next labs.  I have also asked her to obtain a recent cardiology note regarding her brothers LVAD to see if we need to screen her.          Medication Adjustments/Labs and Tests Ordered: Current medicines are reviewed at length with the patient today.  Concerns regarding medicines are outlined above.  Orders Placed This Encounter  Procedures   EKG 12-Lead   Meds ordered this encounter  Medications   amLODipine  (NORVASC ) 5 MG tablet    Sig: Take 1 tablet (5 mg total) by mouth daily.    Dispense:  90 tablet    Refill:  1   carvedilol  (COREG ) 25 MG tablet    Sig: Take 1 tablet (25 mg total) by mouth 2 (two) times daily.    Dispense:  180 tablet    Refill:  1   atorvastatin  (LIPITOR) 40 MG tablet    Sig: Take 0.5 tablets (20 mg total) by mouth daily.    Dispense:  45 tablet    Refill:  3   ticagrelor  (BRILINTA ) 60 MG TABS tablet    Sig: Take 0.5 tablets (30 mg total) by mouth 2 (two) times daily.    Dispense:  180 tablet    Refill:  3    Pt needs to be seen in office for further refills. 2nd attempt    Patient Instructions  Medication Instructions:  TAKE BRILINTA  60MG  TWICE DAILY *If you need a refill on your cardiac medications before your next appointment, please call your pharmacy*  Lab Work: NONE  Testing/Procedures: NONE  Follow-Up: At [facility], you and your health needs are our priority.  As part of our continuing mission to provide you with exceptional heart care, we have created designated Provider Care Teams.  These Care Teams include your primary Cardiologist (physician) and Advanced Practice Providers (APPs -  Physician Assistants and Nurse Practitioners) who all work together to provide you with the care you need, when you need it.  Your next appointment:   12  month(s)  Provider:   Alm Anner, MD     Other Instructions       Signed, Jon Nat Hails, [place]  10/15/2023 9:01 AM     [facility]

## 2023-10-13 ENCOUNTER — Other Ambulatory Visit: Payer: Self-pay

## 2023-10-13 ENCOUNTER — Other Ambulatory Visit: Payer: Self-pay | Admitting: Podiatry

## 2023-10-15 ENCOUNTER — Telehealth: Payer: Self-pay | Admitting: Physician Assistant

## 2023-10-15 ENCOUNTER — Other Ambulatory Visit (HOSPITAL_COMMUNITY): Payer: Self-pay

## 2023-10-15 ENCOUNTER — Encounter: Payer: Self-pay | Admitting: Physician Assistant

## 2023-10-15 ENCOUNTER — Ambulatory Visit: Payer: Commercial Managed Care - PPO | Attending: Physician Assistant | Admitting: Physician Assistant

## 2023-10-15 VITALS — BP 104/60 | HR 79 | Ht 65.0 in | Wt 183.0 lb

## 2023-10-15 DIAGNOSIS — I10 Essential (primary) hypertension: Secondary | ICD-10-CM | POA: Diagnosis not present

## 2023-10-15 DIAGNOSIS — E785 Hyperlipidemia, unspecified: Secondary | ICD-10-CM | POA: Diagnosis not present

## 2023-10-15 DIAGNOSIS — E118 Type 2 diabetes mellitus with unspecified complications: Secondary | ICD-10-CM

## 2023-10-15 DIAGNOSIS — I2510 Atherosclerotic heart disease of native coronary artery without angina pectoris: Secondary | ICD-10-CM

## 2023-10-15 DIAGNOSIS — Z9861 Coronary angioplasty status: Secondary | ICD-10-CM | POA: Diagnosis not present

## 2023-10-15 DIAGNOSIS — Z01810 Encounter for preprocedural cardiovascular examination: Secondary | ICD-10-CM | POA: Diagnosis not present

## 2023-10-15 MED ORDER — ATORVASTATIN CALCIUM 40 MG PO TABS: 20.0000 mg | ORAL_TABLET | Freq: Every day | ORAL | 3 refills | Status: DC

## 2023-10-15 MED ORDER — AMLODIPINE BESYLATE 5 MG PO TABS: 5.0000 mg | ORAL_TABLET | Freq: Every day | ORAL | 1 refills | Status: DC

## 2023-10-15 MED ORDER — TICAGRELOR 60 MG PO TABS
30.0000 mg | ORAL_TABLET | Freq: Two times a day (BID) | ORAL | 3 refills | Status: DC
  Filled 2023-10-15 (×2): qty 180, 180d supply, fill #0

## 2023-10-15 MED ORDER — TICAGRELOR 60 MG PO TABS: 30.0000 mg | ORAL_TABLET | Freq: Two times a day (BID) | ORAL | 3 refills | Status: DC

## 2023-10-15 MED ORDER — TICAGRELOR 60 MG PO TABS
60.0000 mg | ORAL_TABLET | Freq: Two times a day (BID) | ORAL | 3 refills | Status: DC
  Filled 2023-10-15: qty 180, 90d supply, fill #0
  Filled 2023-10-17: qty 60, 30d supply, fill #0
  Filled 2023-11-05: qty 180, 90d supply, fill #0
  Filled 2024-01-31: qty 180, 90d supply, fill #1
  Filled 2024-04-30: qty 180, 90d supply, fill #2
  Filled 2024-08-02 – 2024-08-04 (×2): qty 180, 90d supply, fill #3

## 2023-10-15 MED ORDER — AMLODIPINE BESYLATE 5 MG PO TABS
5.0000 mg | ORAL_TABLET | Freq: Every day | ORAL | 3 refills | Status: AC
  Filled 2023-10-15 – 2023-12-29 (×2): qty 90, 90d supply, fill #0
  Filled 2024-04-05: qty 90, 90d supply, fill #1

## 2023-10-15 MED ORDER — ATORVASTATIN CALCIUM 20 MG PO TABS
20.0000 mg | ORAL_TABLET | Freq: Every day | ORAL | 3 refills | Status: AC
  Filled 2023-10-15 (×2): qty 90, 90d supply, fill #0
  Filled 2024-01-24: qty 90, 90d supply, fill #1

## 2023-10-15 MED ORDER — CARVEDILOL 25 MG PO TABS
25.0000 mg | ORAL_TABLET | Freq: Two times a day (BID) | ORAL | 3 refills | Status: AC
  Filled 2023-10-15 – 2023-12-29 (×2): qty 180, 90d supply, fill #0
  Filled 2024-04-05: qty 180, 90d supply, fill #1

## 2023-10-15 MED ORDER — CARVEDILOL 25 MG PO TABS: 25.0000 mg | ORAL_TABLET | Freq: Two times a day (BID) | ORAL | 1 refills | Status: DC

## 2023-10-15 NOTE — Telephone Encounter (Signed)
 MADE IN ERROR

## 2023-10-15 NOTE — Telephone Encounter (Signed)
*  STAT* If patient is at the pharmacy, call can be transferred to refill team.   1. Which medications need to be refilled? (please list name of each medication and dose if known)   ticagrelor  (BRILINTA ) 60 MG TABS tablet    2. Which pharmacy/location (including street and city if local pharmacy) is medication to be sent to?  ticagrelor  (BRILINTA ) 60 MG TABS tablet   3. Do they need a 30 day or 90 day supply? 90 Per pt the dosage is suppose to be take 1 60 tab twice daily. She said she discuss this with Jon Hails, PA and she was going to send in correct dosage

## 2023-10-15 NOTE — Telephone Encounter (Signed)
 Called and notified new rx was sent. Pt states that she "found" her paperwork, it was in her car

## 2023-10-15 NOTE — Telephone Encounter (Signed)
 Pt was in the office today and saw Micah Flesher, [place]. Pt showed her instructions for her upcoming Colonoscopy. Pt did not get those papers back and needs them. Pt would like a call back.

## 2023-10-15 NOTE — Addendum Note (Signed)
 Addended by: Alyson Ingles on: 10/15/2023 03:57 PM   Modules accepted: Orders

## 2023-10-15 NOTE — Addendum Note (Signed)
 Addended by: Alyson Ingles on: 10/15/2023 09:05 AM   Modules accepted: Orders

## 2023-10-15 NOTE — Patient Instructions (Signed)
 Medication Instructions:  TAKE BRILINTA  60MG  TWICE DAILY *If you need a refill on your cardiac medications before your next appointment, please call your pharmacy*  Lab Work: NONE  Testing/Procedures: NONE  Follow-Up: At [facility], you and your health needs are our priority.  As part of our continuing mission to provide you with exceptional heart care, we have created designated Provider Care Teams.  These Care Teams include your primary Cardiologist (physician) and Advanced Practice Providers (APPs -  Physician Assistants and Nurse Practitioners) who all work together to provide you with the care you need, when you need it.  Your next appointment:   12 month(s)  Provider:   Alm Clay, MD     Other Instructions

## 2023-10-16 ENCOUNTER — Other Ambulatory Visit (HOSPITAL_COMMUNITY): Payer: Self-pay

## 2023-10-16 MED ORDER — PEG 3350-KCL-NA BICARB-NACL 420 G PO SOLR
ORAL | 0 refills | Status: AC
  Filled 2023-10-16: qty 4000, 1d supply, fill #0

## 2023-10-16 MED ORDER — BISACODYL 5 MG PO TBEC
DELAYED_RELEASE_TABLET | ORAL | 0 refills | Status: AC
Start: 2023-09-16 — End: ?
  Filled 2023-10-16: qty 4, 1d supply, fill #0

## 2023-10-17 ENCOUNTER — Other Ambulatory Visit (HOSPITAL_COMMUNITY): Payer: Self-pay

## 2023-10-20 ENCOUNTER — Other Ambulatory Visit (HOSPITAL_COMMUNITY): Payer: Self-pay

## 2023-10-22 DIAGNOSIS — D124 Benign neoplasm of descending colon: Secondary | ICD-10-CM | POA: Diagnosis not present

## 2023-10-22 DIAGNOSIS — Z09 Encounter for follow-up examination after completed treatment for conditions other than malignant neoplasm: Secondary | ICD-10-CM | POA: Diagnosis not present

## 2023-10-22 DIAGNOSIS — Z860101 Personal history of adenomatous and serrated colon polyps: Secondary | ICD-10-CM | POA: Diagnosis not present

## 2023-10-22 DIAGNOSIS — D125 Benign neoplasm of sigmoid colon: Secondary | ICD-10-CM | POA: Diagnosis not present

## 2023-10-22 DIAGNOSIS — D123 Benign neoplasm of transverse colon: Secondary | ICD-10-CM | POA: Diagnosis not present

## 2023-10-23 IMAGING — MG MM DIGITAL SCREENING BILAT W/ TOMO AND CAD
8 series · 8 of 24 positions shown · non-contrast
Comparison: Previous exam(s).

CLINICAL DATA: Screening.

EXAM:
DIGITAL SCREENING BILATERAL MAMMOGRAM WITH TOMOSYNTHESIS AND CAD
TECHNIQUE: Bilateral screening digital craniocaudal and mediolateral oblique
mammograms were obtained. Bilateral screening digital breast
tomosynthesis was performed. The images were evaluated with
computer-aided detection.

[L MLO synth-2D]
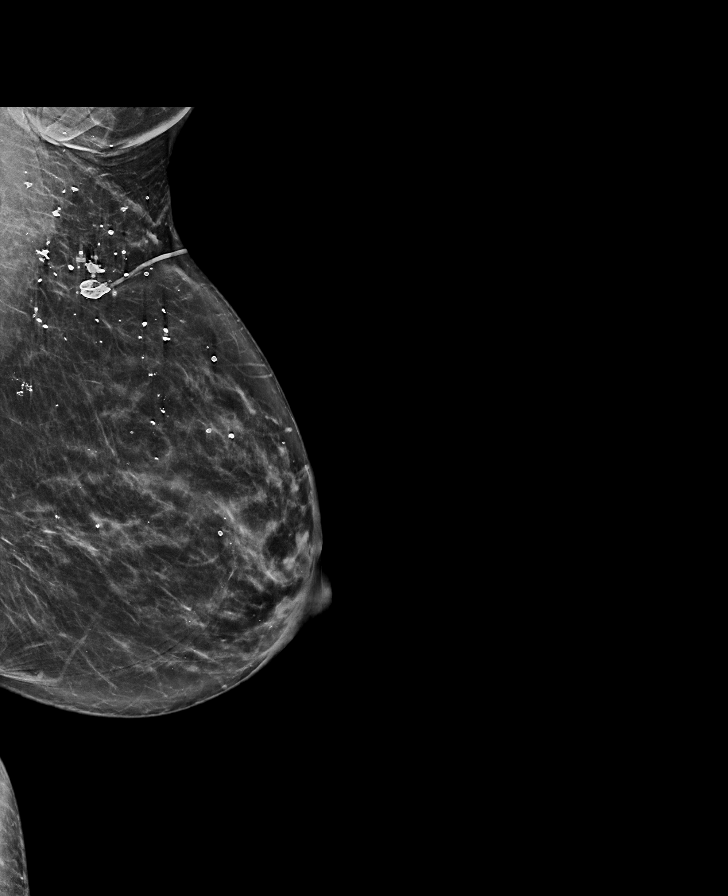

[R CC synth-2D]
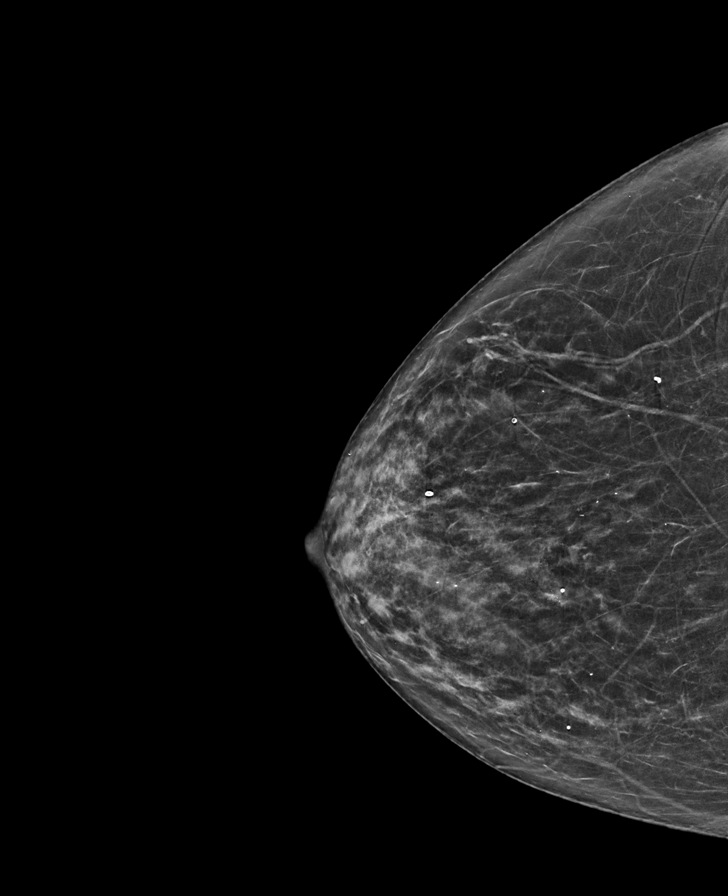

[L CC synth-2D]
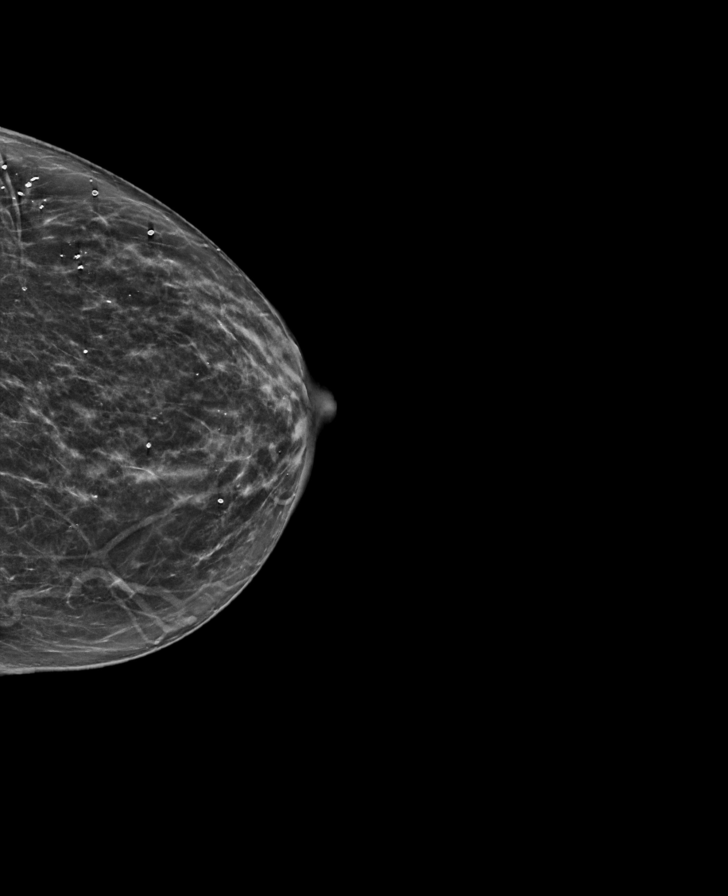

[R MLO synth-2D]
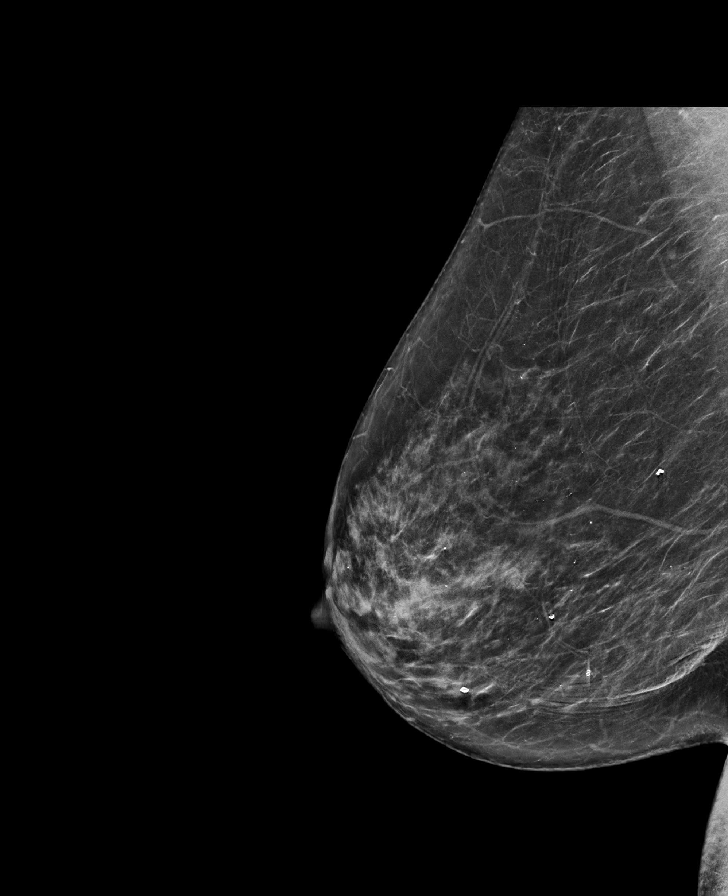

[L MLO tomo · tomo slice 35/68.0]
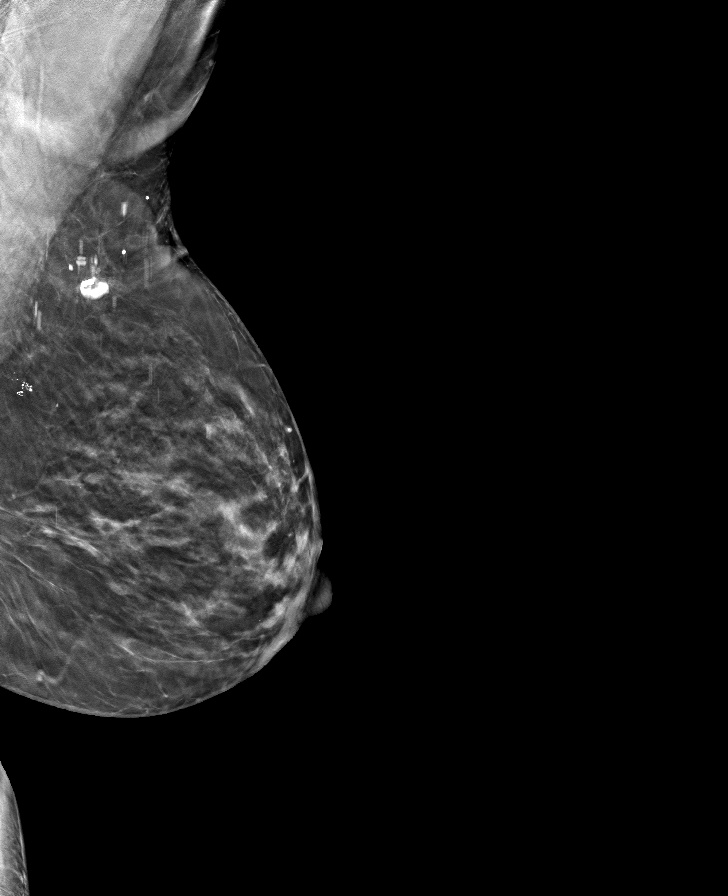

[R MLO tomo · tomo slice 37/72.0]
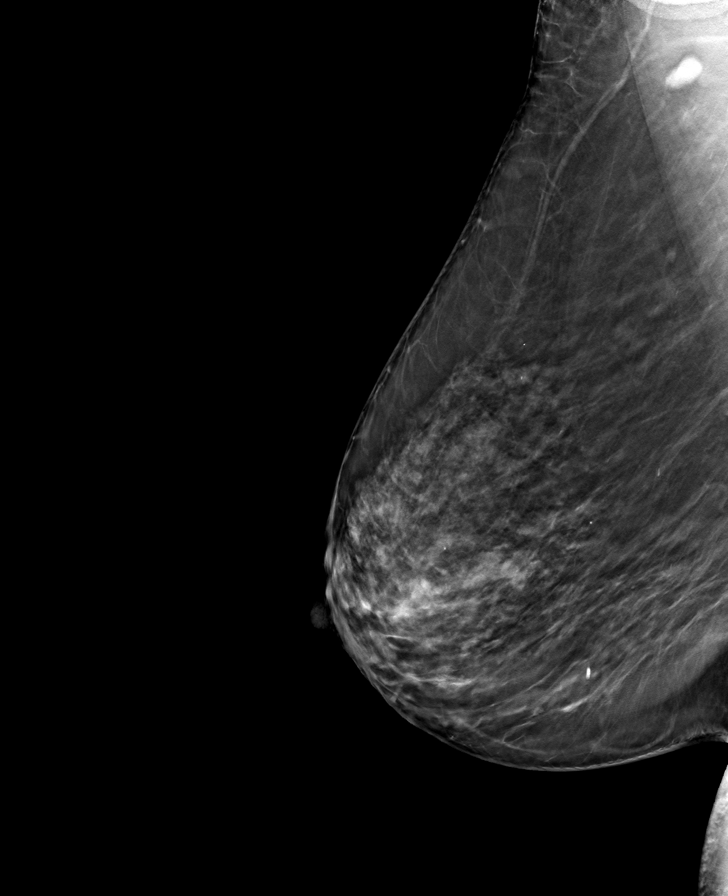

[L CC tomo · tomo slice 31/60.0]
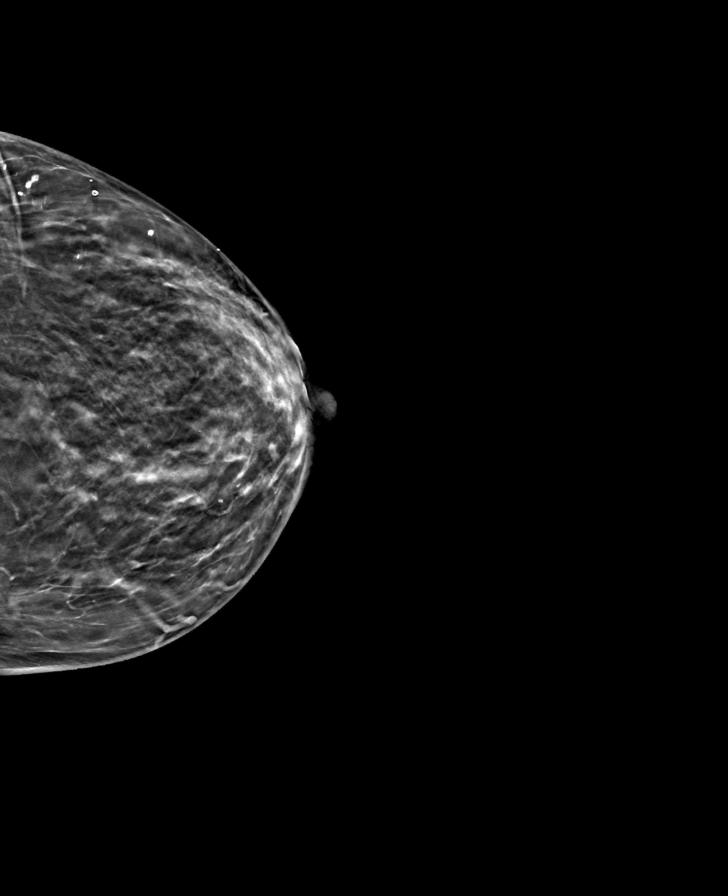

[R CC tomo · tomo slice 31/62.0]
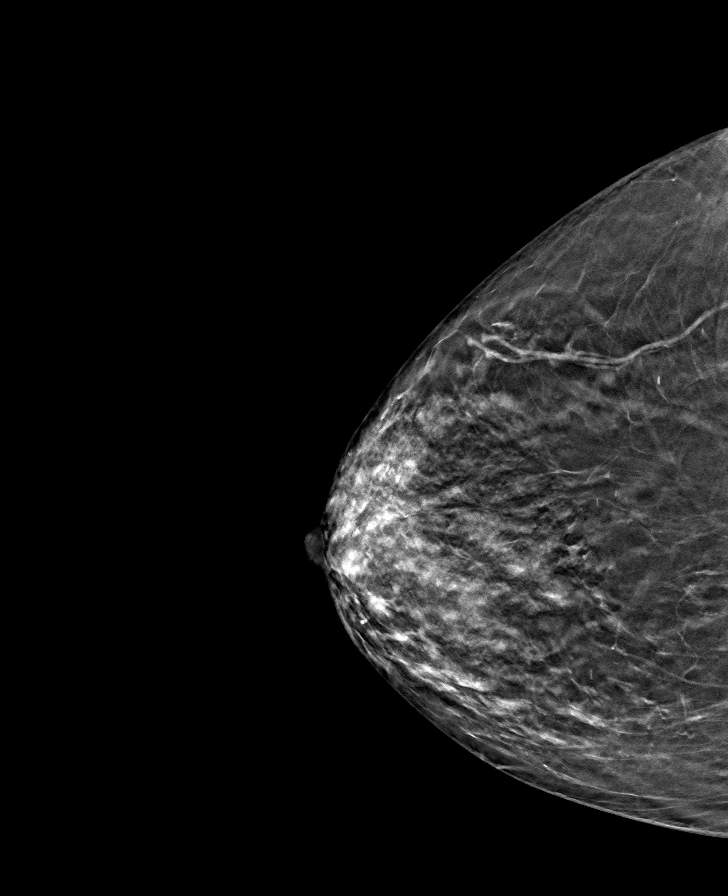

[8 of 24 positions shown; findings below may reference images not displayed]

ACR Breast Density Category c: The breast tissue is heterogeneously
dense, which may obscure small masses.
FINDINGS: There are no findings suspicious for malignancy.
IMPRESSION: No mammographic evidence of malignancy. A result letter of this
screening mammogram will be mailed directly to the patient.

RECOMMENDATION:
Screening mammogram in one year. (Code:[id number]-BC3)

BI-RADS CATEGORY  1: Negative.

## 2023-10-26 DIAGNOSIS — H524 Presbyopia: Secondary | ICD-10-CM | POA: Diagnosis not present

## 2023-11-05 ENCOUNTER — Other Ambulatory Visit (HOSPITAL_COMMUNITY): Payer: Self-pay

## 2023-11-06 ENCOUNTER — Other Ambulatory Visit (HOSPITAL_COMMUNITY): Payer: Self-pay

## 2023-11-06 ENCOUNTER — Other Ambulatory Visit: Payer: Self-pay

## 2023-11-06 MED ORDER — CLOTRIMAZOLE 1 % EX CREA
1.0000 | TOPICAL_CREAM | Freq: Two times a day (BID) | CUTANEOUS | 0 refills | Status: AC
  Filled 2023-11-06: qty 28, 5d supply, fill #0

## 2023-11-07 ENCOUNTER — Other Ambulatory Visit (HOSPITAL_COMMUNITY): Payer: Self-pay

## 2023-12-22 ENCOUNTER — Other Ambulatory Visit (HOSPITAL_COMMUNITY): Payer: Self-pay

## 2023-12-29 ENCOUNTER — Other Ambulatory Visit (HOSPITAL_COMMUNITY): Payer: Self-pay

## 2023-12-29 ENCOUNTER — Other Ambulatory Visit: Payer: Self-pay | Admitting: Podiatry

## 2023-12-30 ENCOUNTER — Other Ambulatory Visit (HOSPITAL_COMMUNITY): Payer: Self-pay

## 2023-12-30 ENCOUNTER — Other Ambulatory Visit: Payer: Self-pay

## 2023-12-30 MED ORDER — RYBELSUS 7 MG PO TABS
ORAL_TABLET | ORAL | 0 refills | Status: AC
Start: 2023-12-30 — End: ?
  Filled 2023-12-30: qty 90, 90d supply, fill #0

## 2024-01-07 ENCOUNTER — Other Ambulatory Visit (HOSPITAL_COMMUNITY): Payer: Self-pay

## 2024-01-08 ENCOUNTER — Other Ambulatory Visit (HOSPITAL_COMMUNITY): Payer: Self-pay

## 2024-01-08 ENCOUNTER — Other Ambulatory Visit: Payer: Self-pay

## 2024-01-08 MED ORDER — METFORMIN HCL 500 MG PO TABS
500.0000 mg | ORAL_TABLET | Freq: Two times a day (BID) | ORAL | 0 refills | Status: DC
  Filled 2024-01-08: qty 180, 90d supply, fill #0

## 2024-01-14 ENCOUNTER — Other Ambulatory Visit (HOSPITAL_COMMUNITY): Payer: Self-pay

## 2024-01-14 DIAGNOSIS — E669 Obesity, unspecified: Secondary | ICD-10-CM | POA: Diagnosis not present

## 2024-01-14 DIAGNOSIS — E1169 Type 2 diabetes mellitus with other specified complication: Secondary | ICD-10-CM | POA: Diagnosis not present

## 2024-01-14 DIAGNOSIS — J302 Other seasonal allergic rhinitis: Secondary | ICD-10-CM | POA: Diagnosis not present

## 2024-01-14 DIAGNOSIS — I10 Essential (primary) hypertension: Secondary | ICD-10-CM | POA: Diagnosis not present

## 2024-01-14 DIAGNOSIS — I25119 Atherosclerotic heart disease of native coronary artery with unspecified angina pectoris: Secondary | ICD-10-CM | POA: Diagnosis not present

## 2024-01-14 DIAGNOSIS — F17200 Nicotine dependence, unspecified, uncomplicated: Secondary | ICD-10-CM | POA: Diagnosis not present

## 2024-01-14 DIAGNOSIS — E7800 Pure hypercholesterolemia, unspecified: Secondary | ICD-10-CM | POA: Diagnosis not present

## 2024-01-14 MED ORDER — CARVEDILOL 25 MG PO TABS
25.0000 mg | ORAL_TABLET | Freq: Two times a day (BID) | ORAL | 1 refills | Status: AC
Start: 2024-01-14 — End: ?
  Filled 2024-04-05 – 2024-04-30 (×2): qty 180, 90d supply, fill #0
  Filled 2024-08-02 – 2024-08-04 (×2): qty 180, 90d supply, fill #1

## 2024-01-14 MED ORDER — JARDIANCE 25 MG PO TABS
25.0000 mg | ORAL_TABLET | Freq: Every day | ORAL | 1 refills | Status: DC
  Filled 2024-01-14 – 2024-02-28 (×2): qty 90, 90d supply, fill #0
  Filled 2024-04-30 – 2024-05-28 (×2): qty 90, 90d supply, fill #1

## 2024-01-14 MED ORDER — FLUCONAZOLE 150 MG PO TABS
150.0000 mg | ORAL_TABLET | Freq: Once | ORAL | 2 refills | Status: AC | PRN
  Filled 2024-01-14: qty 4, 4d supply, fill #0

## 2024-01-14 MED ORDER — AMLODIPINE BESYLATE 5 MG PO TABS
5.0000 mg | ORAL_TABLET | Freq: Every day | ORAL | 1 refills | Status: AC
  Filled 2024-04-30: qty 90, 90d supply, fill #0
  Filled 2024-08-02 – 2024-08-04 (×2): qty 90, 90d supply, fill #1

## 2024-01-14 MED ORDER — ATORVASTATIN CALCIUM 40 MG PO TABS
40.0000 mg | ORAL_TABLET | Freq: Every day | ORAL | 3 refills | Status: AC
  Filled 2024-01-14: qty 90, 90d supply, fill #0
  Filled 2024-05-28: qty 90, 90d supply, fill #1
  Filled 2024-09-16: qty 90, 90d supply, fill #2

## 2024-01-14 MED ORDER — METFORMIN HCL 500 MG PO TABS
500.0000 mg | ORAL_TABLET | Freq: Two times a day (BID) | ORAL | 0 refills | Status: DC
  Filled 2024-04-05 – 2024-04-30 (×2): qty 180, 90d supply, fill #0

## 2024-01-15 ENCOUNTER — Other Ambulatory Visit (HOSPITAL_COMMUNITY): Payer: Self-pay

## 2024-01-16 ENCOUNTER — Other Ambulatory Visit (HOSPITAL_COMMUNITY): Payer: Self-pay

## 2024-01-24 ENCOUNTER — Other Ambulatory Visit (HOSPITAL_COMMUNITY): Payer: Self-pay

## 2024-01-25 ENCOUNTER — Other Ambulatory Visit: Payer: Self-pay | Admitting: Family Medicine

## 2024-01-25 ENCOUNTER — Other Ambulatory Visit (HOSPITAL_COMMUNITY): Payer: Self-pay

## 2024-01-25 DIAGNOSIS — Z1231 Encounter for screening mammogram for malignant neoplasm of breast: Secondary | ICD-10-CM

## 2024-01-26 ENCOUNTER — Other Ambulatory Visit: Payer: Self-pay

## 2024-01-26 ENCOUNTER — Other Ambulatory Visit (HOSPITAL_COMMUNITY): Payer: Self-pay

## 2024-01-26 MED ORDER — GLUCOSE BLOOD VI STRP
1.0000 | ORAL_STRIP | Freq: Every day | 2 refills | Status: AC
Start: 2024-01-25 — End: ?
  Filled 2024-01-26 (×2): qty 100, 90d supply, fill #0

## 2024-01-26 MED ORDER — RYBELSUS 14 MG PO TABS
ORAL_TABLET | ORAL | 1 refills | Status: AC
  Filled 2024-01-26 – 2024-02-28 (×3): qty 90, 90d supply, fill #0
  Filled 2024-04-05 – 2024-04-30 (×2): qty 30, 30d supply, fill #0
  Filled 2024-05-28: qty 30, 30d supply, fill #1
  Filled 2024-07-10: qty 30, 30d supply, fill #2
  Filled 2024-08-20 – 2024-11-06 (×2): qty 30, 30d supply, fill #3

## 2024-01-26 MED ORDER — FREESTYLE LITE W/DEVICE KIT
PACK | 0 refills | Status: AC
  Filled 2024-01-26: qty 1, 28d supply, fill #0
  Filled 2024-01-26: qty 1, 1d supply, fill #0

## 2024-01-30 ENCOUNTER — Other Ambulatory Visit (HOSPITAL_COMMUNITY): Payer: Self-pay

## 2024-02-01 ENCOUNTER — Other Ambulatory Visit: Payer: Self-pay | Admitting: Family Medicine

## 2024-02-01 ENCOUNTER — Other Ambulatory Visit (HOSPITAL_COMMUNITY): Payer: Self-pay

## 2024-02-01 ENCOUNTER — Other Ambulatory Visit: Payer: Self-pay

## 2024-02-01 DIAGNOSIS — Z0000 Encounter for general adult medical examination without abnormal findings: Secondary | ICD-10-CM

## 2024-02-09 ENCOUNTER — Ambulatory Visit
Admission: RE | Admit: 2024-02-09 | Discharge: 2024-02-09 | Disposition: A | Discharge status: Home / Self Care | Source: Ambulatory Visit | Attending: Family Medicine | Admitting: Family Medicine

## 2024-02-09 DIAGNOSIS — Z1231 Encounter for screening mammogram for malignant neoplasm of breast: Secondary | ICD-10-CM | POA: Diagnosis not present

## 2024-02-09 DIAGNOSIS — Z0000 Encounter for general adult medical examination without abnormal findings: Secondary | ICD-10-CM

## 2024-02-12 ENCOUNTER — Other Ambulatory Visit (HOSPITAL_COMMUNITY): Payer: Self-pay

## 2024-02-29 ENCOUNTER — Other Ambulatory Visit (HOSPITAL_COMMUNITY): Payer: Self-pay

## 2024-02-29 ENCOUNTER — Other Ambulatory Visit: Payer: Self-pay

## 2024-04-05 ENCOUNTER — Other Ambulatory Visit: Payer: Self-pay

## 2024-04-05 ENCOUNTER — Other Ambulatory Visit (HOSPITAL_COMMUNITY): Payer: Self-pay

## 2024-04-18 ENCOUNTER — Other Ambulatory Visit (HOSPITAL_COMMUNITY): Payer: Self-pay

## 2024-04-30 ENCOUNTER — Other Ambulatory Visit (HOSPITAL_COMMUNITY): Payer: Self-pay

## 2024-05-02 ENCOUNTER — Other Ambulatory Visit (HOSPITAL_COMMUNITY): Payer: Self-pay

## 2024-05-02 MED ORDER — DICLOFENAC SODIUM 1 % EX GEL
1.0000 | Freq: Every day | CUTANEOUS | 0 refills | Status: AC | PRN
  Filled 2024-05-02: qty 100, 30d supply, fill #0

## 2024-07-10 ENCOUNTER — Other Ambulatory Visit (HOSPITAL_COMMUNITY): Payer: Self-pay

## 2024-07-11 ENCOUNTER — Other Ambulatory Visit (HOSPITAL_COMMUNITY): Payer: Self-pay

## 2024-07-11 ENCOUNTER — Other Ambulatory Visit: Payer: Self-pay

## 2024-07-11 MED ORDER — FLUCONAZOLE 150 MG PO TABS
ORAL_TABLET | ORAL | 2 refills | Status: AC
  Filled 2024-07-11: qty 4, 30d supply, fill #0
  Filled 2024-08-20: qty 4, 4d supply, fill #1
  Filled 2024-11-06: qty 4, 30d supply, fill #1

## 2024-07-12 ENCOUNTER — Other Ambulatory Visit (HOSPITAL_COMMUNITY): Payer: Self-pay

## 2024-08-02 ENCOUNTER — Other Ambulatory Visit (HOSPITAL_COMMUNITY): Payer: Self-pay

## 2024-08-03 ENCOUNTER — Other Ambulatory Visit (HOSPITAL_COMMUNITY): Payer: Self-pay

## 2024-08-03 MED ORDER — METFORMIN HCL 500 MG PO TABS
500.0000 mg | ORAL_TABLET | Freq: Two times a day (BID) | ORAL | 0 refills | Status: DC
  Filled 2024-08-03: qty 180, 90d supply, fill #0

## 2024-08-04 ENCOUNTER — Other Ambulatory Visit (HOSPITAL_COMMUNITY): Payer: Self-pay

## 2024-08-15 ENCOUNTER — Other Ambulatory Visit (HOSPITAL_COMMUNITY): Payer: Self-pay

## 2024-08-19 ENCOUNTER — Other Ambulatory Visit (HOSPITAL_COMMUNITY): Payer: Self-pay

## 2024-08-19 DIAGNOSIS — Z780 Asymptomatic menopausal state: Secondary | ICD-10-CM | POA: Diagnosis not present

## 2024-08-19 DIAGNOSIS — E1169 Type 2 diabetes mellitus with other specified complication: Secondary | ICD-10-CM | POA: Diagnosis not present

## 2024-08-19 DIAGNOSIS — E669 Obesity, unspecified: Secondary | ICD-10-CM | POA: Diagnosis not present

## 2024-08-19 DIAGNOSIS — G8929 Other chronic pain: Secondary | ICD-10-CM | POA: Diagnosis not present

## 2024-08-19 DIAGNOSIS — I10 Essential (primary) hypertension: Secondary | ICD-10-CM | POA: Diagnosis not present

## 2024-08-19 DIAGNOSIS — I25119 Atherosclerotic heart disease of native coronary artery with unspecified angina pectoris: Secondary | ICD-10-CM | POA: Diagnosis not present

## 2024-08-19 DIAGNOSIS — M25552 Pain in left hip: Secondary | ICD-10-CM | POA: Diagnosis not present

## 2024-08-19 DIAGNOSIS — Z0000 Encounter for general adult medical examination without abnormal findings: Secondary | ICD-10-CM | POA: Diagnosis not present

## 2024-08-19 DIAGNOSIS — F17200 Nicotine dependence, unspecified, uncomplicated: Secondary | ICD-10-CM | POA: Diagnosis not present

## 2024-08-19 DIAGNOSIS — E7800 Pure hypercholesterolemia, unspecified: Secondary | ICD-10-CM | POA: Diagnosis not present

## 2024-08-19 MED ORDER — FLUCONAZOLE 150 MG PO TABS
150.0000 mg | ORAL_TABLET | Freq: Every day | ORAL | 2 refills | Status: AC | PRN
  Filled 2024-08-19: qty 4, 4d supply, fill #0

## 2024-08-19 MED ORDER — METFORMIN HCL 500 MG PO TABS
500.0000 mg | ORAL_TABLET | Freq: Two times a day (BID) | ORAL | 0 refills | Status: AC
  Filled 2024-08-19 – 2024-10-19 (×2): qty 180, 90d supply, fill #0

## 2024-08-19 MED ORDER — CARVEDILOL 25 MG PO TABS
25.0000 mg | ORAL_TABLET | Freq: Two times a day (BID) | ORAL | 1 refills | Status: AC
  Filled 2024-08-19 – 2024-10-19 (×2): qty 180, 90d supply, fill #0

## 2024-08-19 MED ORDER — AMLODIPINE BESYLATE 5 MG PO TABS
5.0000 mg | ORAL_TABLET | Freq: Every day | ORAL | 1 refills | Status: AC
  Filled 2024-08-19 – 2024-10-19 (×2): qty 90, 90d supply, fill #0

## 2024-08-19 MED ORDER — JARDIANCE 25 MG PO TABS
25.0000 mg | ORAL_TABLET | Freq: Every day | ORAL | 1 refills | Status: AC
  Filled 2024-08-19 – 2024-09-16 (×3): qty 90, 90d supply, fill #0

## 2024-08-19 MED ORDER — OMEPRAZOLE 40 MG PO CPDR
40.0000 mg | DELAYED_RELEASE_CAPSULE | Freq: Every day | ORAL | 1 refills | Status: AC
  Filled 2024-08-19: qty 90, 90d supply, fill #0
  Filled 2024-11-16: qty 90, 90d supply, fill #1

## 2024-08-19 MED ORDER — ATORVASTATIN CALCIUM 40 MG PO TABS
40.0000 mg | ORAL_TABLET | Freq: Every day | ORAL | 3 refills | Status: AC
  Filled 2024-08-19 – 2024-09-16 (×2): qty 90, 90d supply, fill #0

## 2024-08-19 MED ORDER — RYBELSUS 14 MG PO TABS
14.0000 mg | ORAL_TABLET | Freq: Every day | ORAL | 1 refills | Status: AC
  Filled 2024-08-19: qty 90, 90d supply, fill #0
  Filled 2024-11-16: qty 90, 90d supply, fill #1

## 2024-08-20 ENCOUNTER — Other Ambulatory Visit (HOSPITAL_COMMUNITY): Payer: Self-pay

## 2024-08-22 ENCOUNTER — Other Ambulatory Visit: Payer: Self-pay

## 2024-08-23 ENCOUNTER — Encounter: Payer: Self-pay | Admitting: Family Medicine

## 2024-08-23 ENCOUNTER — Other Ambulatory Visit (HOSPITAL_COMMUNITY): Payer: Self-pay

## 2024-08-23 ENCOUNTER — Other Ambulatory Visit: Payer: Self-pay | Admitting: Family Medicine

## 2024-08-23 DIAGNOSIS — F17200 Nicotine dependence, unspecified, uncomplicated: Secondary | ICD-10-CM

## 2024-08-26 ENCOUNTER — Other Ambulatory Visit: Payer: Self-pay | Admitting: Family Medicine

## 2024-08-26 DIAGNOSIS — F17200 Nicotine dependence, unspecified, uncomplicated: Secondary | ICD-10-CM

## 2024-09-01 ENCOUNTER — Encounter: Payer: Self-pay | Admitting: Family Medicine

## 2024-09-06 ENCOUNTER — Ambulatory Visit (INDEPENDENT_AMBULATORY_CARE_PROVIDER_SITE_OTHER): Admitting: Podiatry

## 2024-09-06 ENCOUNTER — Encounter: Payer: Self-pay | Admitting: Podiatry

## 2024-09-06 DIAGNOSIS — B351 Tinea unguium: Secondary | ICD-10-CM

## 2024-09-06 DIAGNOSIS — L603 Nail dystrophy: Secondary | ICD-10-CM | POA: Diagnosis not present

## 2024-09-06 DIAGNOSIS — L600 Ingrowing nail: Secondary | ICD-10-CM

## 2024-09-06 NOTE — Progress Notes (Unsigned)
 Subjective:   Patient ID: Cindy Weber, female   DOB: 63 y.o.   MRN: [id number]   HPI Chief Complaint  Patient presents with   Ingrown Toenail    Left foot medial border hard skin build up x 1 year. Had a pedicure yesterday for birthday. NIDDM A41C 65.34.   63 year old female presents today with the above concerns.  She states from an intermittent for the last year.  The nail grows out and gets longer gets tender usually a effort to get is changing out any pain.  She does not report any swelling, redness or any drainage.  No open lesions.  Her sugar runs between 120-150.    Review of Systems  All other systems reviewed and are negative.  Past Medical History:  Diagnosis Date   Breast cancer in female [facility]) 2013   - Lumpectomy & LN resection.  Chemo -- > residual hair loss (eye brows & hair);    Coronary artery disease involving native heart with angina pectoris 02/2013   Non-STEMI - 100% CTO RCA (small codominant posterior and.; Normal LAD. The distal circumflex 95% stenosis - PCI with DES 2 (Xience expedition stents overlapping -2.25 mm x 15 mm and 2.5 mm x 23 mm both postdilated 2.5 mm balloon)   DM (diabetes mellitus), type 2 with complications (HCC)    CAD - MI.    Essential hypertension    Hyperlipidemia associated with type 2 diabetes mellitus (HCC)    Non-STEMI (non-ST elevated myocardial infarction) (HCC) 2014   PCI - (2 stents) -formerly cared for by Dr. Tanda [facility].    Past Surgical History:  Procedure Laterality Date   BREAST LUMPECTOMY Left 2014   CARDIAC CATHETERIZATION  02/15/2013   [facility] (Dr. Grayce): Codominant system.  100% CTO of small RCA.  d Cx 95%, normal LAD. -> PCI of dCx   LEFT OOPHORECTOMY  1998   PERCUTANEOUS CORONARY STENT INTERVENTION (PCI-S)  02/16/2013   [facility] -(Dr. Grayce): PCI dCX 95%,- Xience expedition DES 2.25 mm x 15 mm overlap approximately with a Xience exhibition DES 2.5 mm x 23 mm -postdilated to 2.5 mm throughout    TRANSTHORACIC ECHOCARDIOGRAM  02/15/2013   [facility]: Normal LV Size & function. EF ~55-60%.  GR1DD. Mild MR & LAE.      Current Outpatient Medications:    amLODipine  (NORVASC ) 5 MG tablet, Take 1 tablet (5 mg total) by mouth daily., Disp: 90 tablet, Rfl: 1   atorvastatin  (LIPITOR) 40 MG tablet, Take 1 tablet (40 mg total) by mouth daily., Disp: 90 tablet, Rfl: 3   atorvastatin  (LIPITOR) 40 MG tablet, Take 1 tablet by mouth once a day, Disp: 90 tablet, Rfl: 3   bisacodyl  (DULCOLAX) 5 MG EC tablet, Take 4 tablets by mouth as directed by MD., Disp: 4 tablet, Rfl: 0   Blood Glucose Monitoring Suppl (FREESTYLE LITE) w/Device KIT, For use when checking blood sugars. Fingerstick once a day., Disp: 1 kit, Rfl: 0   carvedilol  (COREG ) 25 MG tablet, Take 1 tablet (25 mg total) by mouth 2 (two) times daily., Disp: 180 tablet, Rfl: 1   carvedilol  (COREG ) 25 MG tablet, Take 1 tablet (25 mg total) by mouth 2 (two) times daily., Disp: 180 tablet, Rfl: 1   cephALEXin  (KEFLEX ) 500 MG capsule, Take 1 capsule (500 mg total) by mouth 2 (two) times daily., Disp: 14 capsule, Rfl: 0   cetirizine (ZYRTEC) 10 MG tablet, 1 tablet, Disp: , Rfl:    clotrimazole  (LOTRIMIN ) 1 %  cream, Apply externally twice a day for 7 days., Disp: 30 g, Rfl: 0   clotrimazole  (LOTRIMIN ) 1 % cream, Apply topically 2 (two) times daily as needed for 7 days., Disp: 30 g, Rfl: 0   diclofenac  Sodium (VOLTAREN ) 1 % GEL, Apply 2 grams topically to affected area of foot 2- 4 times daily as directe., Disp: 100 g, Rfl: 2   diclofenac  Sodium (VOLTAREN ) 1 % GEL, Apply topically daily as needed., Disp: 100 g, Rfl: 0   empagliflozin  (JARDIANCE ) 25 MG TABS tablet, Take 1 tablet orally once a day, Disp: 90 tablet, Rfl: 1   fluconazole  (DIFLUCAN ) 150 MG tablet, Take 1 tablet Orally once a day as needed for yeast infection 30 days, Disp: 4 tablet, Rfl: 2   fluconazole  (DIFLUCAN ) 150 MG tablet, Take 1 tablet (150 mg total) by mouth once daily as needed for up to 1  dose ( for yeast infection )., Disp: 4 tablet, Rfl: 2   fluconazole  (DIFLUCAN ) 150 MG tablet, Take 1 tablet by mouth once a day as needed for yeast infection, Disp: 4 tablet, Rfl: 2   fluconazole  (DIFLUCAN ) 150 MG tablet, Take 1 tablet orally once a day as needed for yeast infection, Disp: 4 tablet, Rfl: 2   glucose blood (FREESTYLE LITE) test strip, Use as directed once daily, Disp: 100 each, Rfl: 1   glucose blood test strip, Use to test blood sugar 2 times a day, Disp: 300 each, Rfl: 3   glucose blood test strip, Use to check blood sugar once daily, Disp: 100 each, Rfl: 2   Ibuprofen -Famotidine  (DUEXIS ) 800-26.6 MG TABS, Take 1 tablet by mouth 2 (two) times daily as needed., Disp: 90 tablet, Rfl: 0   Lancets (FREESTYLE) lancets, Use as directed once a day, Disp: 100 each, Rfl: 1   letrozole (FEMARA) 2.5 MG tablet, Take 1 tablet by mouth daily., Disp: , Rfl: 11   metFORMIN  (GLUCOPHAGE ) 500 MG tablet, Take 1 tablet (500 mg total) by mouth 2 (two) times daily with a meal., Disp: 180 tablet, Rfl: 0   neomycin -polymyxin b-dexamethasone  (MAXITROL ) 3.5-10000-0.1 SUSP, Place 1 drop into both eyes 3 times a day for 1 week, Disp: 5 mL, Rfl: 1   ofloxacin  (OCUFLOX ) 0.3 % ophthalmic solution, Place 1 drop into affected eye four times a day for 7 days, Disp: 5 mL, Rfl: 0   omeprazole  (PRILOSEC) 40 MG capsule, Take 1 capsule (40 mg total) by mouth daily., Disp: 90 capsule, Rfl: 1   omeprazole  (PRILOSEC) 40 MG capsule, Take 1 capsule orally once a day, Disp: 90 capsule, Rfl: 1   polyethylene glycol-electrolytes (NULYTELY) 420 g solution, Take as directed by MD., Disp: 4000 mL, Rfl: 0   Semaglutide  (RYBELSUS ) 14 MG TABS, Take 1 tablet (14 mg total) by mouth daily at least 30 minutes before first food, beverage or other oral medicine of the day., Disp: 90 tablet, Rfl: 1   Semaglutide  (RYBELSUS ) 14 MG TABS, Take 1 tablet (14 mg total) by mouth daily at least 30 minutes before first food, beverage or other oral  medicine of the day., Disp: 90 tablet, Rfl: 1   ticagrelor  (BRILINTA ) 60 MG TABS tablet, Take 1 tablet (60 mg total) by mouth 2 (two) times daily., Disp: 180 tablet, Rfl: 3   vitamin B-12 (CYANOCOBALAMIN) 500 MCG tablet, Take 1 tablet by mouth daily., Disp: , Rfl:    albuterol  (VENTOLIN  HFA) 108 (90 Base) MCG/ACT inhaler, Inhale 2 puffs into the lungs every 6 (six) hours as needed., Disp:  18 g, Rfl: 5   amLODipine  (NORVASC ) 5 MG tablet, Take 1 tablet (5 mg total) by mouth daily., Disp: 90 tablet, Rfl: 3   amLODipine  (NORVASC ) 5 MG tablet, Take 1 tablet (5 mg total) by mouth daily., Disp: 90 tablet, Rfl: 1   atorvastatin  (LIPITOR) 20 MG tablet, Take 1 tablet (20 mg total) by mouth daily., Disp: 90 tablet, Rfl: 3   Blood Glucose Monitoring Suppl (FREESTYLE LITE) w/Device KIT, Use as directed to test blood sugar once a day, Disp: 1 kit, Rfl: 0   buPROPion  (WELLBUTRIN  XL) 150 MG 24 hr tablet, Take 1 tablet (150 mg total) by mouth twice daily (Patient not taking: Reported on 10/15/2023), Disp: 180 tablet, Rfl: 1   buPROPion  (WELLBUTRIN  XL) 150 MG 24 hr tablet, Take 1 tablet (150 mg total) by mouth 2 (two) times daily., Disp: 180 tablet, Rfl: 1   carvedilol  (COREG ) 25 MG tablet, Take 1 tablet (25 mg total) by mouth 2 (two) times daily., Disp: 180 tablet, Rfl: 3   fluconazole  (DIFLUCAN ) 150 MG tablet, Take 1 tablet (150 mg total) by mouth daily as needed for yeast infection.  (4 tablets must last 30 days per MD) (Patient not taking: Reported on 10/15/2023), Disp: 4 tablet, Rfl: 2   fluconazole  (DIFLUCAN ) 150 MG tablet, Take 1 tablet by mouth daily as needed for yeast infection (Patient not taking: Reported on 10/15/2023), Disp: 4 tablet, Rfl: 2   fluconazole  (DIFLUCAN ) 150 MG tablet, Take 1 tablet (150 mg total) by mouth daily as needed for yeast infection, Disp: 4 tablet, Rfl: 2   metFORMIN  (GLUCOPHAGE ) 500 MG tablet, Take 1 tablet (500 mg total) by mouth 2 (two) times daily with a meal. (Patient not taking:  Reported on 10/15/2023), Disp: 180 tablet, Rfl: 1   Semaglutide  (RYBELSUS ) 7 MG TABS, Take 1 tablet (7 mg total) by mouth daily at least 30 minutes before first food, beverage or other oral medicine of the day. (Patient not taking: Reported on 10/15/2023), Disp: 90 tablet, Rfl: 0   Semaglutide  (RYBELSUS ) 7 MG TABS, Take 1 tablet (7 mg total) by mouth daily at least 30 minutes before first food, beverage or other oral medicine of the day., Disp: 90 tablet, Rfl: 0  Allergies  Allergen Reactions   Lisinopril      Other reaction(s): cough   Losartan Potassium     Other reaction(s): cuogh   Valsartan  Cough   Other Rash    Pt reports that hair and skin dye burned her skin.         Objective:  Physical Exam  General: AAO x3, NAD  Dermatological: Nails are mildly hypertrophic, dystrophic with brown discoloration.  There is no edema, erythema.  Incurvation present on the medial hallux toenail the left side with tenderness palpation on the very distal portion of the toenail.  Was recently just trimmed yesterday.  There is no edema or any signs of infection.  No open lesions.  Vascular: Dorsalis Pedis artery and Posterior Tibial artery pedal pulses are 2/4 bilateral with immedate capillary fill time. There is no pain with calf compression, swelling, warmth, erythema.   Neruologic: Grossly intact via light touch bilateral.   Musculoskeletal: No other areas of discomfort.       Assessment:   Ingrown toenail, onychomycosis     Plan:  -Treatment options discussed including all alternatives, risks, and complications -Etiology of symptoms were discussed - We discussed different treatment options for ingrowing toenails.  Discussed partial nail avulsion versus routine debridement.  She does not want proceed with a partial nail avulsion today.  Recommended routine debridement.  The nails are thicker and I recommended a culture.  Sharply debrided the nails and left with any complications or bleeding  sent for culture, pathology.  Return for nail culture results.  Donnice JONELLE Fees DPM

## 2024-09-06 NOTE — Patient Instructions (Signed)
 You can use UREA  NAIL GEL on the thicker toenails  --  Diabetes Mellitus and Foot Care Diabetes, also called diabetes mellitus, may cause problems with your feet and legs because of poor blood flow (circulation). Poor circulation may make your skin: Become thinner and drier. Break more easily. Heal more slowly. Peel and crack. You may also have nerve damage (neuropathy). This can cause decreased feeling in your legs and feet. This means that you may not notice minor injuries to your feet that could lead to more serious problems. Finding and treating problems early is the best way to prevent future foot problems. How to care for your feet Foot hygiene  Wash your feet daily with warm water and mild soap. Do not use hot water. Then, pat your feet and the areas between your toes until they are fully dry. Do not soak your feet. This can dry your skin. Trim your toenails straight across. Do not dig under them or around the cuticle. File the edges of your nails with an emery board or nail file. Apply a moisturizing lotion or petroleum jelly to the skin on your feet and to dry, brittle toenails. Use lotion that does not contain alcohol  and is unscented. Do not apply lotion between your toes. Shoes and socks Wear clean socks or stockings every day. Make sure they are not too tight. Do not wear knee-high stockings. These may decrease blood flow to your legs. Wear shoes that fit well and have enough cushioning. Always look in your shoes before you put them on to be sure there are no objects inside. To break in new shoes, wear them for just a few hours a day. This prevents injuries on your feet. Wounds, scrapes, corns, and calluses  Check your feet daily for blisters, cuts, bruises, sores, and redness. If you cannot see the bottom of your feet, use a mirror or ask someone for help. Do not cut off corns or calluses or try to remove them with medicine. If you find a minor scrape, cut, or break in the skin  on your feet, keep it and the skin around it clean and dry. You may clean these areas with mild soap and water. Do not clean the area with peroxide, alcohol , or iodine . If you have a wound, scrape, corn, or callus on your foot, look at it several times a day to make sure it is healing and not infected. Check for: Redness, swelling, or pain. Fluid or blood. Warmth. Pus or a bad smell. General tips Do not cross your legs. This may decrease blood flow to your feet. Do not use heating pads or hot water bottles on your feet. They may burn your skin. If you have lost feeling in your feet or legs, you may not know this is happening until it is too late. Protect your feet from hot and cold by wearing shoes, such as at the beach or on hot pavement. Schedule a complete foot exam at least once a year or more often if you have foot problems. Report any cuts, sores, or bruises to your health care provider right away. Where to find more information American Diabetes Association: diabetes.org Association of Diabetes Care & Education Specialists: diabeteseducator.org Contact a health care provider if: You have a condition that increases your risk of infection, and you have any cuts, sores, or bruises on your feet. You have an injury that is not healing. You have redness on your legs or feet. You feel burning or tingling  in your legs or feet. You have pain or cramps in your legs and feet. Your legs or feet are numb. Your feet always feel cold. You have pain around any toenails. Get help right away if: You have a wound, scrape, corn, or callus on your foot and: You have signs of infection. You have a fever. You have a red line going up your leg. This information is not intended to replace advice given to you by your health care provider. Make sure you discuss any questions you have with your health care provider. Document Revised: 04/02/2022 Document Reviewed: 04/02/2022 Elsevier Patient Education  2024  [employer].

## 2024-09-07 ENCOUNTER — Ambulatory Visit
Admission: RE | Admit: 2024-09-07 | Discharge: 2024-09-07 | Disposition: A | Source: Ambulatory Visit | Attending: Family Medicine | Admitting: Family Medicine

## 2024-09-07 DIAGNOSIS — R911 Solitary pulmonary nodule: Secondary | ICD-10-CM | POA: Diagnosis not present

## 2024-09-07 DIAGNOSIS — F17200 Nicotine dependence, unspecified, uncomplicated: Secondary | ICD-10-CM

## 2024-09-07 DIAGNOSIS — J439 Emphysema, unspecified: Secondary | ICD-10-CM | POA: Diagnosis not present

## 2024-09-16 ENCOUNTER — Other Ambulatory Visit: Payer: Self-pay

## 2024-09-16 ENCOUNTER — Other Ambulatory Visit (HOSPITAL_COMMUNITY): Payer: Self-pay

## 2024-09-19 ENCOUNTER — Other Ambulatory Visit: Payer: Self-pay | Admitting: Podiatry

## 2024-09-20 ENCOUNTER — Other Ambulatory Visit: Payer: Self-pay

## 2024-09-22 ENCOUNTER — Ambulatory Visit: Payer: Self-pay | Admitting: Podiatry

## 2024-09-23 ENCOUNTER — Other Ambulatory Visit: Payer: Self-pay

## 2024-09-27 ENCOUNTER — Other Ambulatory Visit: Payer: Self-pay

## 2024-09-28 ENCOUNTER — Other Ambulatory Visit: Payer: Self-pay

## 2024-09-29 ENCOUNTER — Other Ambulatory Visit: Payer: Self-pay

## 2024-10-07 ENCOUNTER — Other Ambulatory Visit (HOSPITAL_COMMUNITY): Payer: Self-pay

## 2024-10-19 ENCOUNTER — Other Ambulatory Visit: Payer: Self-pay

## 2024-10-19 ENCOUNTER — Other Ambulatory Visit (HOSPITAL_COMMUNITY): Payer: Self-pay

## 2024-11-06 ENCOUNTER — Other Ambulatory Visit: Payer: Self-pay | Admitting: Podiatry

## 2024-11-07 ENCOUNTER — Other Ambulatory Visit (HOSPITAL_COMMUNITY): Payer: Self-pay

## 2024-11-07 ENCOUNTER — Other Ambulatory Visit: Payer: Self-pay

## 2024-11-07 ENCOUNTER — Other Ambulatory Visit: Payer: Self-pay | Admitting: Physician Assistant

## 2024-11-07 MED ORDER — TICAGRELOR 60 MG PO TABS
60.0000 mg | ORAL_TABLET | Freq: Two times a day (BID) | ORAL | 0 refills | Status: AC
  Filled 2024-11-07 – 2024-11-10 (×2): qty 60, 30d supply, fill #0

## 2024-11-07 MED ORDER — DICLOFENAC SODIUM 1 % EX GEL
2.0000 g | Freq: Four times a day (QID) | CUTANEOUS | 2 refills | Status: AC
  Filled 2024-11-07: qty 100, 13d supply, fill #0

## 2024-11-08 ENCOUNTER — Encounter: Payer: Self-pay | Admitting: Pharmacist

## 2024-11-08 ENCOUNTER — Other Ambulatory Visit: Payer: Self-pay

## 2024-11-09 ENCOUNTER — Other Ambulatory Visit: Payer: Self-pay

## 2024-11-10 ENCOUNTER — Other Ambulatory Visit (HOSPITAL_COMMUNITY): Payer: Self-pay

## 2024-11-16 ENCOUNTER — Other Ambulatory Visit (HOSPITAL_COMMUNITY): Payer: Self-pay

## 2024-11-16 ENCOUNTER — Other Ambulatory Visit: Payer: Self-pay
